# Patient Record
Sex: Male | Born: 1955 | Hispanic: No
Health system: Southern US, Community
[De-identification: ages and names within clinical notes are randomized; demographics above are authoritative.]

## PROBLEM LIST (undated history)

## (undated) DIAGNOSIS — I509 Heart failure, unspecified: Secondary | ICD-10-CM

## (undated) DIAGNOSIS — E669 Obesity, unspecified: Secondary | ICD-10-CM

## (undated) DIAGNOSIS — E041 Nontoxic single thyroid nodule: Secondary | ICD-10-CM

## (undated) HISTORY — PX: SHOULDER ARTHROSCOPY: SHX128

## (undated) HISTORY — PX: THYROIDECTOMY, PARTIAL: SHX18

---

## 2004-10-05 ENCOUNTER — Ambulatory Visit: Payer: Self-pay

## 2004-11-02 ENCOUNTER — Other Ambulatory Visit: Payer: Self-pay

## 2004-11-20 ENCOUNTER — Ambulatory Visit: Payer: Self-pay | Admitting: Unknown Physician Specialty

## 2005-12-10 ENCOUNTER — Ambulatory Visit: Payer: Self-pay | Admitting: Unknown Physician Specialty

## 2006-03-17 IMAGING — CT CT NECK WITH CONTRAST
1 of 2 series · 9 of 14 positions shown, 12 images · IV contrast (agent unspecified)
Comparison: none

REASON FOR EXAM: Thyroid mass
COMMENTS:

PROCEDURE:     CT  - CT NECK WITH CONTRAST  - October 05, 2004  [DATE]
RESULT:     Axial images were obtained from the base of the skull to the
thoracic inlet with intravenous injection of contrast.

[Series 2: soft tissue · axial · 0.54mm/px · z∈[+121,+343]mm · 9 of 94 slices shown, 12 images]
[im 10/94  soft-tissue]
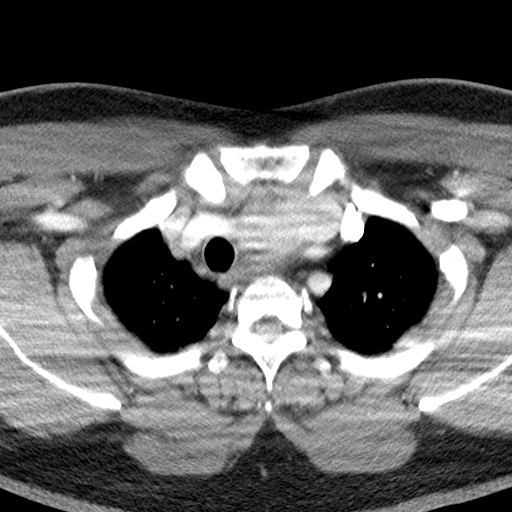
[im 10/94  bone]
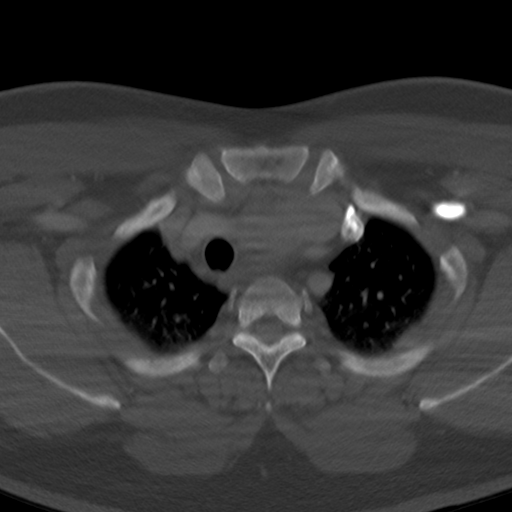
[im 19/94  bone]
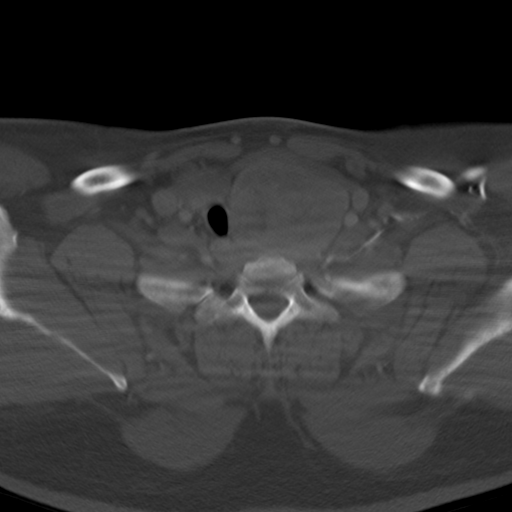
[im 28/94  bone]
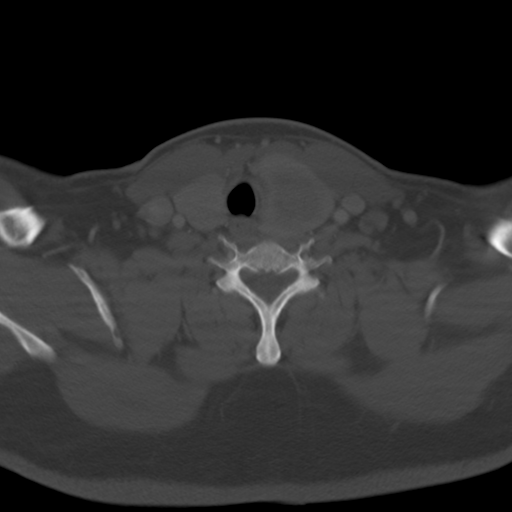
[im 38/94  bone]
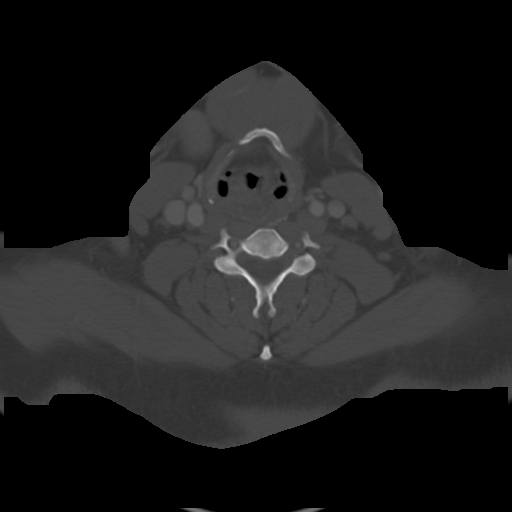
[im 47/94  soft-tissue]
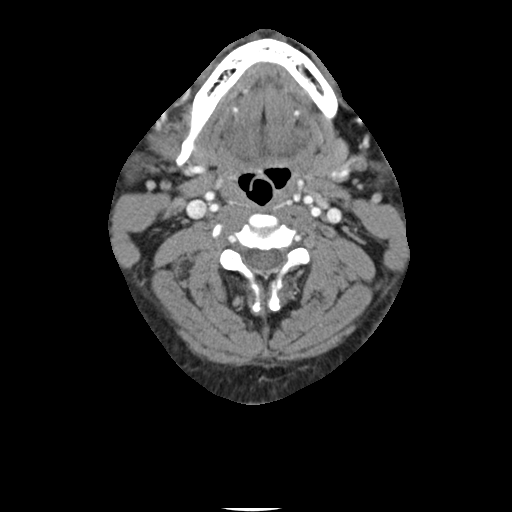
[im 47/94  bone]
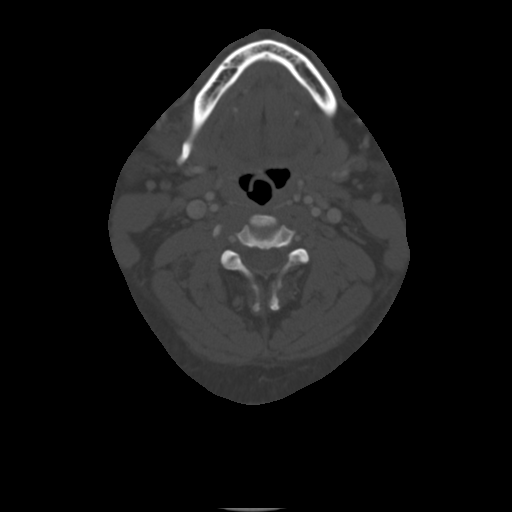
[im 56/94  bone]
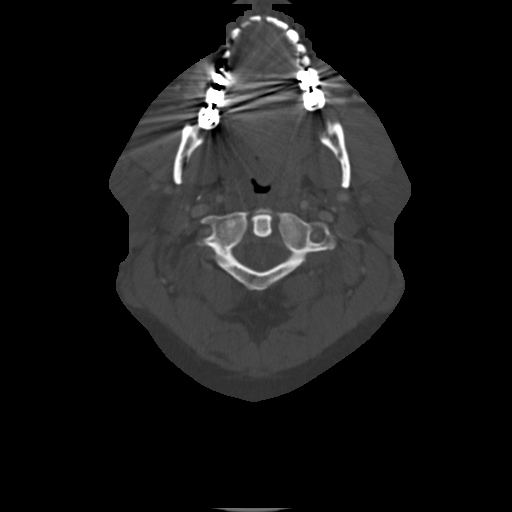
[im 66/94  bone]
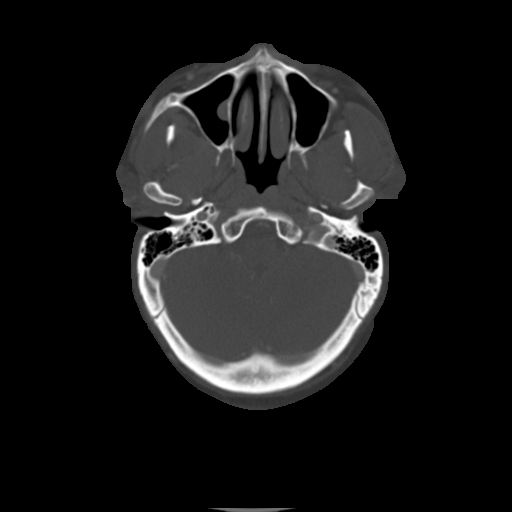
[im 75/94  bone]
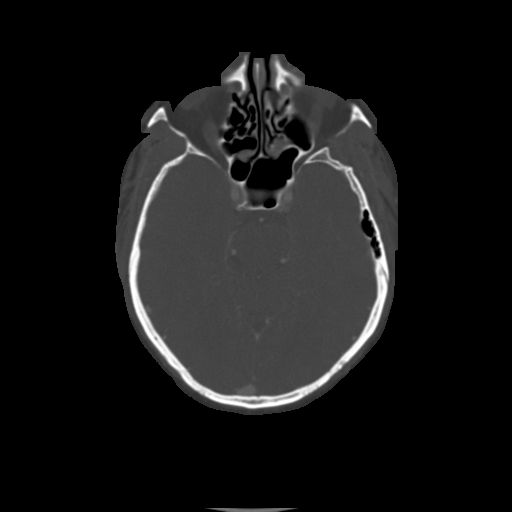
[im 84/94  soft-tissue]
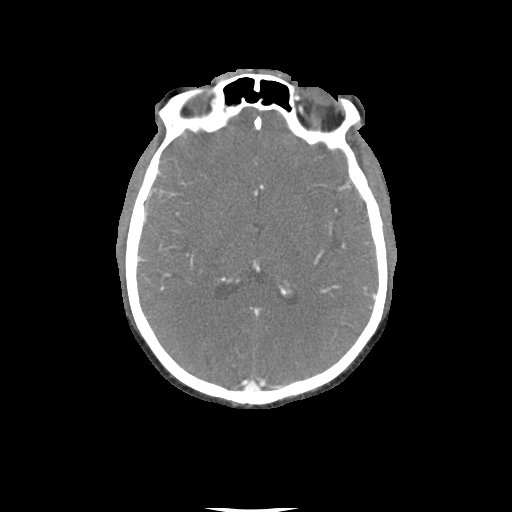
[im 84/94  bone]
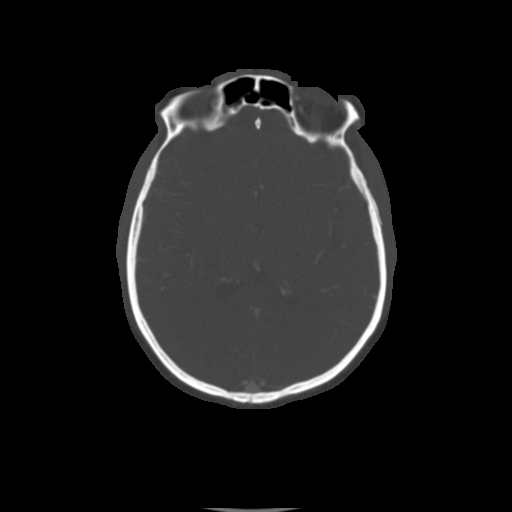

[9 of 14 positions shown; findings below may reference images not displayed]

FINDINGS: In the LEFT lobe of the thyroid, there is a mass measuring 5.0 x
6.5 cm with variable attenuation and enhancement with displacement of the
tracheal air column to the RIGHT of midline. The mass extends into the
thoracic inlet to the level of the brachiocephalic vein crossing.

There are a few, scattered lymph nodes bilaterally in the submandibular
gland region and posterior to the sternocleidomastoid muscles although they
do not appear enlarged. The largest one is on the RIGHT in the submandibular
gland measuring 9.0 mm in axis dimension.

The parotid as well as the submandibular glands appears intact.
IMPRESSION: Large, LEFT thyroid lobe mass with deviation of the trachea
to the RIGHT of midline. The left thyroid mass is extending to the thoracic
inlet.

The report was called to Dr. Ram and apparently the original dictation
was lost. The study was obtained on 10/05/2004 and called on 10/09/2004.

## 2014-09-20 ENCOUNTER — Ambulatory Visit
Admit: 2014-09-20 | Disposition: A | Discharge status: Home / Self Care | Payer: Self-pay | Attending: Family Medicine | Admitting: Family Medicine

## 2014-09-20 LAB — DOT URINE DIP
BLOOD: NEGATIVE
Glucose,UR: NEGATIVE
Protein: NEGATIVE
Specific Gravity: 1.02 (ref 1.000–1.030)

## 2015-12-22 ENCOUNTER — Encounter
Admission: RE | Disposition: A | Discharge status: Home / Self Care | Payer: Self-pay | Source: Ambulatory Visit | Attending: Unknown Physician Specialty

## 2015-12-22 ENCOUNTER — Ambulatory Visit
Admission: RE | Admit: 2015-12-22 | Discharge: 2015-12-22 | Disposition: A | Discharge status: Home / Self Care | Payer: Managed Care, Other (non HMO) | Source: Ambulatory Visit | Attending: Unknown Physician Specialty | Admitting: Unknown Physician Specialty

## 2015-12-22 ENCOUNTER — Ambulatory Visit: Payer: Managed Care, Other (non HMO) | Admitting: Anesthesiology

## 2015-12-22 ENCOUNTER — Encounter: Payer: Self-pay | Admitting: Anesthesiology

## 2015-12-22 DIAGNOSIS — Z6841 Body Mass Index (BMI) 40.0 and over, adult: Secondary | ICD-10-CM | POA: Insufficient documentation

## 2015-12-22 DIAGNOSIS — Z1211 Encounter for screening for malignant neoplasm of colon: Secondary | ICD-10-CM | POA: Diagnosis not present

## 2015-12-22 DIAGNOSIS — K640 First degree hemorrhoids: Secondary | ICD-10-CM | POA: Diagnosis not present

## 2015-12-22 HISTORY — PX: COLONOSCOPY WITH PROPOFOL: SHX5780

## 2015-12-22 SURGERY — COLONOSCOPY WITH PROPOFOL
Anesthesia: General

## 2015-12-22 MED ORDER — LIDOCAINE HCL (CARDIAC) 20 MG/ML IV SOLN
INTRAVENOUS | Status: DC | PRN
  Administered 2015-12-22: 30 mg via INTRAVENOUS

## 2015-12-22 MED ORDER — PROPOFOL 10 MG/ML IV BOLUS
INTRAVENOUS | Status: DC | PRN
  Administered 2015-12-22: 100 mg via INTRAVENOUS

## 2015-12-22 MED ORDER — PROPOFOL 500 MG/50ML IV EMUL
INTRAVENOUS | Status: DC | PRN
  Administered 2015-12-22: 150 ug/kg/min via INTRAVENOUS

## 2015-12-22 MED ORDER — SODIUM CHLORIDE 0.9 % IV SOLN
INTRAVENOUS | Status: DC
  Administered 2015-12-22: 10:00:00 via INTRAVENOUS

## 2015-12-22 MED ORDER — SODIUM CHLORIDE 0.9 % IV SOLN: INTRAVENOUS | Status: DC

## 2015-12-22 NOTE — Anesthesia Preprocedure Evaluation (Signed)
Anesthesia Evaluation  Patient identified by MRN, date of birth, ID band Patient awake    Reviewed: Allergy & Precautions, H&P , NPO status , Patient's Chart, lab work & pertinent test results, reviewed documented beta blocker date and time   History of Anesthesia Complications Negative for: history of anesthetic complications  Airway Mallampati: IV  TM Distance: >3 FB Neck ROM: full    Dental no notable dental hx. (+) Teeth Intact   Pulmonary neg pulmonary ROS,    Pulmonary exam normal breath sounds clear to auscultation       Cardiovascular Exercise Tolerance: Good negative cardio ROS Normal cardiovascular exam Rhythm:regular Rate:Normal     Neuro/Psych negative neurological ROS  negative psych ROS   GI/Hepatic negative GI ROS, Neg liver ROS,   Endo/Other  neg diabetesMorbid obesity  Renal/GU negative Renal ROS  negative genitourinary   Musculoskeletal   Abdominal   Peds  Hematology negative hematology ROS (+)   Anesthesia Other Findings History reviewed. No pertinent past medical history.   Reproductive/Obstetrics negative OB ROS                             Anesthesia Physical Anesthesia Plan  ASA: III  Anesthesia Plan: General   Post-op Pain Management:    Induction:   Airway Management Planned:   Additional Equipment:   Intra-op Plan:   Post-operative Plan:   Informed Consent: I have reviewed the patients History and Physical, chart, labs and discussed the procedure including the risks, benefits and alternatives for the proposed anesthesia with the patient or authorized representative who has indicated his/her understanding and acceptance.   Dental Advisory Given  Plan Discussed with: Anesthesiologist, CRNA and Surgeon  Anesthesia Plan Comments:         Anesthesia Quick Evaluation

## 2015-12-22 NOTE — H&P (Signed)
   Primary Care Physician:  Estell HarpinVINES,DAIN, MD Primary Gastroenterologist:  Dr. Mechele CollinElliott  Pre-Procedure History & Physical: HPI:  Bryce Jakesed Douglas Charbonnet is a 60 y.o. male is here for an colonoscopy.   History reviewed. No pertinent past medical history.  History reviewed. No pertinent past surgical history.  Prior to Admission medications   Medication Sig Start Date End Date Taking? Authorizing Provider  naproxen sodium (ANAPROX) 220 MG tablet Take 220 mg by mouth 2 (two) times daily with a meal.   Yes Historical Provider, MD    Allergies as of 11/25/2015  . (Not on File)    History reviewed. No pertinent family history.  Social History   Social History  . Marital Status: Married    Spouse Name: N/A  . Number of Children: N/A  . Years of Education: N/A   Occupational History  . Not on file.   Social History Main Topics  . Smoking status: Not on file  . Smokeless tobacco: Not on file  . Alcohol Use: Not on file  . Drug Use: Not on file  . Sexual Activity: Not on file   Other Topics Concern  . Not on file   Social History Narrative  . No narrative on file    Review of Systems: See HPI, otherwise negative ROS  Physical Exam: BP 127/70 mmHg  Pulse 47  Temp(Src) 97.8 F (36.6 C) (Oral)  Resp 17  Ht 5\' 8"  (1.727 m)  Wt 120.203 kg (265 lb)  BMI 40.30 kg/m2  SpO2 100% General:   Alert,  pleasant and cooperative in NAD Head:  Normocephalic and atraumatic. Neck:  Supple; no masses or thyromegaly. Lungs:  Clear throughout to auscultation.    Heart:  Regular rate and rhythm. Abdomen:  Soft, nontender and nondistended. Normal bowel sounds, without guarding, and without rebound.   Neurologic:  Alert and  oriented x4;  grossly normal neurologically.  Impression/Plan: Bryce Brock is here for an colonoscopy to be performed for screening colonoscopy  Risks, benefits, limitations, and alternatives regarding  colonoscopy have been reviewed with the patient.  Questions  have been answered.  All parties agreeable.   Lynnae PrudeELLIOTT, ROBERT, MD  12/22/2015, 10:14 AM

## 2015-12-22 NOTE — Transfer of Care (Signed)
Immediate Anesthesia Transfer of Care Note  Patient: Bryce Brock  Procedure(s) Performed: Procedure(s): COLONOSCOPY WITH PROPOFOL (N/A)  Patient Location: Endoscopy Unit  Anesthesia Type:General  Level of Consciousness: sedated  Airway & Oxygen Therapy: Patient Spontanous Breathing and Patient connected to nasal cannula oxygen  Post-op Assessment: Report given to RN and Post -op Vital signs reviewed and stable  Post vital signs: Reviewed and stable  Last Vitals:  Filed Vitals:   12/22/15 0931  BP: 127/70  Pulse: 47  Temp: 36.6 C  Resp: 17    Last Pain: There were no vitals filed for this visit.       Complications: No apparent anesthesia complications

## 2015-12-22 NOTE — Anesthesia Postprocedure Evaluation (Signed)
Anesthesia Post Note  Patient: Bryce Brock  Procedure(s) Performed: Procedure(s) (LRB): COLONOSCOPY WITH PROPOFOL (N/A)  Patient location during evaluation: Endoscopy Anesthesia Type: General Level of consciousness: awake and alert Pain management: pain level controlled Vital Signs Assessment: post-procedure vital signs reviewed and stable Respiratory status: spontaneous breathing, nonlabored ventilation, respiratory function stable and patient connected to nasal cannula oxygen Cardiovascular status: blood pressure returned to baseline and stable Postop Assessment: no signs of nausea or vomiting Anesthetic complications: no    Last Vitals:  Filed Vitals:   12/22/15 1039 12/22/15 1040  BP: 99/59 99/59  Pulse: 55   Temp: 36.4 C   Resp: 19     Last Pain: There were no vitals filed for this visit.               Lenard SimmerAndrew Diego Ulbricht

## 2015-12-22 NOTE — Op Note (Signed)
[facility] Gastroenterology Patient Name: Bryce Brock Procedure Date: 12/22/2015 10:13 AM MRN: [id number] Account #: [id number] Date of Birth: 04-25-1956 Admit Type: Outpatient Age: [age] [facility] Gender: Male Note Status: Finalized Procedure:            Colonoscopy Indications:          Screening for colorectal malignant neoplasm Providers:            Scot Junobert T. Elliott, MD Referring MD:         Estell Harpinain Vines, MD (Referring MD) Medicines:            Propofol per Anesthesia Complications:        No immediate complications. Procedure:            Pre-Anesthesia Assessment:                       - After reviewing the risks and benefits, the patient                        was deemed in satisfactory condition to undergo the                        procedure.                       After obtaining informed consent, the colonoscope was                        passed under direct vision. Throughout the procedure,                        the patient's blood pressure, pulse, and oxygen                        saturations were monitored continuously. The                        Colonoscope was introduced through the anus and                        advanced to the the cecum, identified by appendiceal                        orifice and ileocecal valve. The colonoscopy was                        performed without difficulty. The patient tolerated the                        procedure well. The quality of the bowel preparation                        was excellent. Findings:      Internal hemorrhoids were found during endoscopy. The hemorrhoids were       small and Grade I (internal hemorrhoids that do not prolapse).      The exam was otherwise without abnormality. Impression:           - Internal hemorrhoids.                       - The examination was otherwise  normal.                       - No specimens collected. Recommendation:       - Repeat colonoscopy in 10 years for  screening purposes. Scot Junobert T Elliott, MD 12/22/2015 10:54:03 AM This report has been signed electronically. Number of Addenda: 0 Note Initiated On: 12/22/2015 10:13 AM Scope Withdrawal Time: 0 hours 9 minutes 2 seconds  Total Procedure Duration: 0 hours 13 minutes 29 seconds       [facility]

## 2015-12-23 ENCOUNTER — Encounter: Payer: Self-pay | Admitting: Unknown Physician Specialty

## 2019-04-28 ENCOUNTER — Inpatient Hospital Stay
Admission: EM | Admit: 2019-04-28 | Discharge: 2019-04-30 | DRG: 291 | Disposition: A | Discharge status: Home / Self Care | Payer: Commercial Managed Care - PPO | Source: Ambulatory Visit | Attending: Internal Medicine | Admitting: Internal Medicine

## 2019-04-28 ENCOUNTER — Encounter: Payer: Self-pay | Admitting: Emergency Medicine

## 2019-04-28 ENCOUNTER — Emergency Department: Payer: Commercial Managed Care - PPO

## 2019-04-28 ENCOUNTER — Inpatient Hospital Stay: Payer: Commercial Managed Care - PPO

## 2019-04-28 ENCOUNTER — Other Ambulatory Visit: Payer: Self-pay

## 2019-04-28 DIAGNOSIS — R778 Other specified abnormalities of plasma proteins: Secondary | ICD-10-CM

## 2019-04-28 DIAGNOSIS — I248 Other forms of acute ischemic heart disease: Secondary | ICD-10-CM | POA: Diagnosis present

## 2019-04-28 DIAGNOSIS — R509 Fever, unspecified: Secondary | ICD-10-CM | POA: Diagnosis not present

## 2019-04-28 DIAGNOSIS — E669 Obesity, unspecified: Secondary | ICD-10-CM | POA: Diagnosis present

## 2019-04-28 DIAGNOSIS — I5031 Acute diastolic (congestive) heart failure: Secondary | ICD-10-CM | POA: Diagnosis present

## 2019-04-28 DIAGNOSIS — I160 Hypertensive urgency: Secondary | ICD-10-CM | POA: Diagnosis present

## 2019-04-28 DIAGNOSIS — Z20828 Contact with and (suspected) exposure to other viral communicable diseases: Secondary | ICD-10-CM | POA: Diagnosis present

## 2019-04-28 DIAGNOSIS — Z8249 Family history of ischemic heart disease and other diseases of the circulatory system: Secondary | ICD-10-CM

## 2019-04-28 DIAGNOSIS — I50811 Acute right heart failure: Secondary | ICD-10-CM | POA: Diagnosis present

## 2019-04-28 DIAGNOSIS — I509 Heart failure, unspecified: Secondary | ICD-10-CM | POA: Diagnosis not present

## 2019-04-28 DIAGNOSIS — Z79899 Other long term (current) drug therapy: Secondary | ICD-10-CM

## 2019-04-28 DIAGNOSIS — E890 Postprocedural hypothyroidism: Secondary | ICD-10-CM | POA: Diagnosis present

## 2019-04-28 DIAGNOSIS — I110 Hypertensive heart disease with heart failure: Secondary | ICD-10-CM | POA: Diagnosis not present

## 2019-04-28 DIAGNOSIS — Z6841 Body Mass Index (BMI) 40.0 and over, adult: Secondary | ICD-10-CM

## 2019-04-28 DIAGNOSIS — Z8639 Personal history of other endocrine, nutritional and metabolic disease: Secondary | ICD-10-CM

## 2019-04-28 DIAGNOSIS — R0600 Dyspnea, unspecified: Secondary | ICD-10-CM

## 2019-04-28 HISTORY — DX: Obesity, unspecified: E66.9

## 2019-04-28 HISTORY — DX: Nontoxic single thyroid nodule: E04.1

## 2019-04-28 LAB — COMPREHENSIVE METABOLIC PANEL
ALT: 29 U/L (ref 0–44)
AST: 20 U/L (ref 15–41)
Albumin: 3.7 g/dL (ref 3.5–5.0)
Alkaline Phosphatase: 70 U/L (ref 38–126)
Anion gap: 9 (ref 5–15)
BUN: 16 mg/dL (ref 8–23)
CO2: 23 mmol/L (ref 22–32)
Calcium: 8.7 mg/dL — ABNORMAL LOW (ref 8.9–10.3)
Chloride: 106 mmol/L (ref 98–111)
Creatinine, Ser: 0.95 mg/dL (ref 0.61–1.24)
GFR calc Af Amer: >60 mL/min (ref >60)
GFR calc non Af Amer: >60 mL/min (ref >60)
Glucose, Bld: 99 mg/dL (ref 70–99)
Potassium: 3.8 mmol/L (ref 3.5–5.1)
Sodium: 138 mmol/L (ref 135–145)
Total Bilirubin: 0.9 mg/dL (ref 0.3–1.2)
Total Protein: 6.8 g/dL (ref 6.5–8.1)

## 2019-04-28 LAB — LACTIC ACID, PLASMA: Lactic Acid, Venous: 1.2 mmol/L (ref 0.5–1.9)

## 2019-04-28 LAB — CBC
HCT: 41.1 % (ref 39.0–52.0)
Hemoglobin: 12.7 g/dL — ABNORMAL LOW (ref 13.0–17.0)
MCH: 21.7 pg — ABNORMAL LOW (ref 26.0–34.0)
MCHC: 30.9 g/dL (ref 30.0–36.0)
MCV: 70.1 fL — ABNORMAL LOW (ref 80.0–100.0)
Platelets: 190 10*3/uL (ref 150–400)
RBC: 5.86 MIL/uL — ABNORMAL HIGH (ref 4.22–5.81)
RDW: 18.3 % — ABNORMAL HIGH (ref 11.5–15.5)
WBC: 6.3 10*3/uL (ref 4.0–10.5)
nRBC: 0 % (ref 0.0–0.2)

## 2019-04-28 LAB — TROPONIN I (HIGH SENSITIVITY): Troponin I (High Sensitivity): 151 ng/L (ref <18)

## 2019-04-28 LAB — BRAIN NATRIURETIC PEPTIDE: B Natriuretic Peptide: 268 pg/mL — ABNORMAL HIGH (ref 0.0–100.0)

## 2019-04-28 LAB — TSH: TSH: 2.671 u[IU]/mL (ref 0.350–4.500)

## 2019-04-28 LAB — MAGNESIUM: Magnesium: 2.2 mg/dL (ref 1.7–2.4)

## 2019-04-28 MED ORDER — SODIUM CHLORIDE 0.9% FLUSH
3.0000 mL | Freq: Two times a day (BID) | INTRAVENOUS | Status: DC
  Administered 2019-04-28 – 2019-04-29 (×3): 3 mL via INTRAVENOUS

## 2019-04-28 MED ORDER — ACETAMINOPHEN 325 MG PO TABS: 650.0000 mg | ORAL_TABLET | ORAL | Status: DC | PRN

## 2019-04-28 MED ORDER — FUROSEMIDE 10 MG/ML IJ SOLN
40.0000 mg | Freq: Once | INTRAMUSCULAR | Status: AC
  Administered 2019-04-28: 40 mg via INTRAVENOUS
  Filled 2019-04-28 (×2): qty 4

## 2019-04-28 MED ORDER — LABETALOL HCL 5 MG/ML IV SOLN: 20.0000 mg | INTRAVENOUS | Status: DC | PRN

## 2019-04-28 MED ORDER — FUROSEMIDE 10 MG/ML IJ SOLN
40.0000 mg | Freq: Two times a day (BID) | INTRAMUSCULAR | Status: DC
  Administered 2019-04-29 – 2019-04-30 (×3): 40 mg via INTRAVENOUS
  Filled 2019-04-28 (×3): qty 4

## 2019-04-28 MED ORDER — ENOXAPARIN SODIUM 40 MG/0.4ML ~~LOC~~ SOLN
40.0000 mg | Freq: Two times a day (BID) | SUBCUTANEOUS | Status: DC
  Administered 2019-04-28 – 2019-04-30 (×4): 40 mg via SUBCUTANEOUS
  Filled 2019-04-28 (×4): qty 0.4

## 2019-04-28 MED ORDER — SODIUM CHLORIDE 0.9 % IV SOLN: 250.0000 mL | INTRAVENOUS | Status: DC | PRN

## 2019-04-28 MED ORDER — ONDANSETRON HCL 4 MG/2ML IJ SOLN: 4.0000 mg | Freq: Four times a day (QID) | INTRAMUSCULAR | Status: DC | PRN

## 2019-04-28 MED ORDER — SODIUM CHLORIDE 0.9% FLUSH: 3.0000 mL | INTRAVENOUS | Status: DC | PRN

## 2019-04-28 MED ORDER — ASPIRIN EC 81 MG PO TBEC
81.0000 mg | DELAYED_RELEASE_TABLET | Freq: Every day | ORAL | Status: DC
  Administered 2019-04-28 – 2019-04-30 (×3): 81 mg via ORAL
  Filled 2019-04-28 (×3): qty 1

## 2019-04-28 MED ORDER — LISINOPRIL 5 MG PO TABS
5.0000 mg | ORAL_TABLET | Freq: Every day | ORAL | Status: DC
  Administered 2019-04-28 – 2019-04-30 (×3): 5 mg via ORAL
  Filled 2019-04-28 (×3): qty 1

## 2019-04-28 MED ORDER — ZOLPIDEM TARTRATE 5 MG PO TABS: 5.0000 mg | ORAL_TABLET | Freq: Every evening | ORAL | Status: DC | PRN

## 2019-04-28 MED ORDER — IOHEXOL 350 MG/ML SOLN
75.0000 mL | Freq: Once | INTRAVENOUS | Status: AC | PRN
  Administered 2019-04-28: 75 mL via INTRAVENOUS

## 2019-04-28 NOTE — ED Notes (Signed)
Pt given meal tray.

## 2019-04-28 NOTE — ED Notes (Signed)
Called and placed on hold. Had to hang up due to patient calling out.

## 2019-04-28 NOTE — Plan of Care (Signed)
  Problem: Safety: Goal: Ability to remain free from injury will improve Outcome: Progressing   Problem: Education: Goal: Ability to demonstrate management of disease process will improve Outcome: Progressing Goal: Ability to verbalize understanding of medication therapies will improve Outcome: Progressing Goal: Individualized Educational Video(s) Outcome: Progressing   Problem: Cardiac: Goal: Ability to achieve and maintain adequate cardiopulmonary perfusion will improve Outcome: Progressing

## 2019-04-28 NOTE — ED Triage Notes (Signed)
Pt sent from UC with c/o SOB xfew days. Pt denies fever or cough. PT is 97% on RA, RR even and unlabored. PT tested for covid yesterday and awaiting results

## 2019-04-28 NOTE — ED Notes (Signed)
Pt ambulatory to bathroom

## 2019-04-28 NOTE — ED Notes (Signed)
Admitting MD at bedside.

## 2019-04-28 NOTE — H&P (Signed)
[place] at [facility]   PATIENT NAME: Bryce Brock    MR#:  [id number]  DATE OF BIRTH:  01-13-1956  DATE OF ADMISSION:  04/28/2019  PRIMARY CARE PHYSICIAN: Estell Harpin, MD   REQUESTING/REFERRING PHYSICIAN: Jene Every, MD  CHIEF COMPLAINT:   Chief Complaint  Patient presents with  . Shortness of Breath    HISTORY OF PRESENT ILLNESS:  Bryce Brock  is a 63 y.o. obese African-American male with no chronic medical problems, who presented to the emergency room with acute onset of recent exertional dyspnea which have been worsening over the last couple of days with associated paroxysmal nocturnal dyspnea that woke him up from sleep last night and two-pillow orthopnea with mild lower extremity edema.  He earlier he earlier reported chest tightness to the ER physician however denied it during my interview.  No fever or chills.  He admitted to dry cough with the symptoms and occasional wheezing.  No nausea vomiting or abdominal pain.  No leg pain no recent travels or surgeries.  No sick exposures or exposure to COVID-19.  He works at [employer].  Upon presentation to the emergency room, blood pressure was initially 135/80 and later on 145/104 then 154/104, with a temperature of 99.1 and otherwise normal vital signs.  Pulse extremity was 96 to 99% on room air.  Labs revealed elevated BNP of 268 and troponin I of 151 over otherwise unremarkable.  Two-view chest x-ray showed mild cardiomegaly and pulmonary vascular prominence without overt edema or acute airspace opacity.  EKG showed normal sinus rhythm with rate of 67 with Q waves inferiorly and slightly poor R wave progression.  The patient was given 40 mg of IV Lasix.  He will be admitted to telemetry bed for further evaluation and management of new onset acute CHF. PAST MEDICAL HISTORY:   Past Medical History:  Diagnosis Date  . Obesity   . Thyroid nodule     PAST SURGICAL HISTORY:   Past Surgical History:  Procedure  Laterality Date  . COLONOSCOPY WITH PROPOFOL N/A 12/22/2015   Procedure: COLONOSCOPY WITH PROPOFOL;  Surgeon: Scot Jun, MD;  Location: [facility] ENDOSCOPY;  Service: Endoscopy;  Laterality: N/A;  . SHOULDER ARTHROSCOPY    . THYROIDECTOMY, PARTIAL      SOCIAL HISTORY:   Social History   Tobacco Use  . Smoking status: Never Smoker  . Smokeless tobacco: Never Used  Substance Use Topics  . Alcohol use: Not Currently    FAMILY HISTORY:   Family History  Problem Relation Age of Onset  . Breast cancer Mother   . Hypertension Father   . Coronary artery disease Father   . Diabetes Mellitus I Father   . Hypertension Brother     DRUG ALLERGIES:  No Known Allergies  REVIEW OF SYSTEMS:   ROS As per history of present illness. All pertinent systems were reviewed above. Constitutional,  HEENT, cardiovascular, respiratory, GI, GU, musculoskeletal, neuro, psychiatric, endocrine,  integumentary and hematologic systems were reviewed and are otherwise  negative/unremarkable except for positive findings mentioned above in the HPI.   MEDICATIONS AT HOME:   Prior to Admission medications   Medication Sig Start Date End Date Taking? Authorizing Provider  naproxen sodium (ANAPROX) 220 MG tablet Take 220 mg by mouth 2 (two) times daily with a meal.    [provider]      VITAL SIGNS:  Blood pressure (!) 154/104, pulse 65, temperature 99.1 F (37.3 C), temperature source Oral, resp.  rate 18, SpO2 99 %.  PHYSICAL EXAMINATION:  Physical Exam  GENERAL:  63 y.o.-year-old obese African-American male patient in minimal respiratory distress with conversational dyspnea. EYES: Pupils equal, round, reactive to light and accommodation. No scleral icterus. Extraocular muscles intact.  HEENT: Head atraumatic, normocephalic. Oropharynx and nasopharynx clear.  NECK:  Supple, no jugular venous distention. No thyroid enlargement, no tenderness.  LUNGS: Slightly diminished bibasal breath  sounds with no wheezing, rales,rhonchi or crepitation. No use of accessory muscles of respiration.  CARDIOVASCULAR: Regular rate and rhythm, S1, S2 normal. No murmurs, rubs, or gallops.  ABDOMEN: Soft, nondistended, nontender. Bowel sounds present. No organomegaly or mass.  EXTREMITIES: 1+ bilateral lower extremity edema with no cyanosis, or clubbing.  NEUROLOGIC: Cranial nerves II through XII are intact. Muscle strength 5/5 in all extremities. Sensation intact. Gait not checked.  PSYCHIATRIC: The patient is alert and oriented x 3.  Normal affect and good eye contact. SKIN: No obvious rash, lesion, or ulcer.   LABORATORY PANEL:   CBC Recent Labs  Lab 04/28/19 1836  WBC 6.3  HGB 12.7*  HCT 41.1  PLT 190   ------------------------------------------------------------------------------------------------------------------  Chemistries  Recent Labs  Lab 04/28/19 1836  NA 138  K 3.8  CL 106  CO2 23  GLUCOSE 99  BUN 16  CREATININE 0.95  CALCIUM 8.7*  AST 20  ALT 29  ALKPHOS 70  BILITOT 0.9   ------------------------------------------------------------------------------------------------------------------  Cardiac Enzymes No results for input(s): TROPONINI in the last 168 hours. ------------------------------------------------------------------------------------------------------------------  RADIOLOGY:  Dg Chest 2 View  Result Date: 04/28/2019 CLINICAL DATA:  Shortness of breath EXAM: CHEST - 2 VIEW COMPARISON:  11/02/2004 FINDINGS: Mild cardiomegaly. Pulmonary vascular prominence. Disc degenerative disease of the thoracic spine. IMPRESSION: Mild cardiomegaly and pulmonary vascular prominence without overt edema or acute appearing airspace opacity. Electronically Signed   By: Eddie Candle M.D.   On: 04/28/2019 17:28      IMPRESSION AND PLAN:   1.  New onset acute congestive heart failure, currently unspecified.  I suspect diastolic etiology.  The patient will be  admitted to a telemetry bed and will be diuresed with IV Lasix.  Will follow serial cardiac enzymes.  Will follow electrolytes including potassium and magnesium with diuresis. Will obtain a 2D echo  and a cardiology consultation in a.m. and notify Dr. Clayborn Bigness regarding the consult.  2.  Elevated troponin I.  This is likely secondary to demand ischemia.  Will follow more serial troponin I's.  The patient has no current chest pain.  Given his low-grade fever I would like to get a chest CTA for further assessment.  3.  Hypertensive urgency.  [place] contributing to #1.  The patient will be placed on ACE inhibitor therapy with lisinopril.  We will also add as needed IV labetalol for optimal control.  4.  History of thyroid nodule status post partial thyroidectomy.  We will check TSH level.  5.  DVT prophylaxis.  Subcutaneous Lovenox   All the records are reviewed and case discussed with ED provider. The plan of care was discussed in details with the patient (and family). I answered all questions. The patient agreed to proceed with the above mentioned plan. Further management will depend upon hospital course.   CODE STATUS: Full code  TOTAL TIME TAKING CARE OF THIS PATIENT: 55 minutes.    Christel Mormon M.D on 04/28/2019 at 8:15 PM  [facility]   From 7 PM-7 AM, contact night-coverage [email or web address]  CC: Primary care physician; Maeola Sarah,  MD   Note: This dictation was prepared with Dragon dictation along with smaller phrase technology. Any transcriptional errors that result from this process are unintentional.

## 2019-04-28 NOTE — ED Provider Notes (Signed)
[facility] Emergency Department Provider Note   ____________________________________________    I have reviewed the triage vital signs and the nursing notes.   HISTORY  Chief Complaint Shortness of Breath     HPI Bryce Brock is a 63 y.o. male who presents with complaints of shortness of breath.  Patient reports he has had shortness of breath with exertion as well as some chest tightness over the last couple of days.  This morning he woke up in the middle of the night struggling to breathe, he felt better after he sat up.  He has never had this before.  He denies pleurisy.  No fevers or chills.  No nausea or vomiting.  No myalgias.  No known coronavirus exposure.  No history of heart disease.  No recent travel  History reviewed. No pertinent past medical history.  There are no active problems to display for this patient.   Past Surgical History:  Procedure Laterality Date  . COLONOSCOPY WITH PROPOFOL N/A 12/22/2015   Procedure: COLONOSCOPY WITH PROPOFOL;  Surgeon: Scot Jun, MD;  Location: [facility] ENDOSCOPY;  Service: Endoscopy;  Laterality: N/A;    Prior to Admission medications   Medication Sig Start Date End Date Taking? Authorizing Provider  naproxen sodium (ANAPROX) 220 MG tablet Take 220 mg by mouth 2 (two) times daily with a meal.    [provider]     Allergies Patient has no known allergies.  No family history on file.  Social History Social History   Tobacco Use  . Smoking status: Never Smoker  . Smokeless tobacco: Never Used  Substance Use Topics  . Alcohol use: Not Currently  . Drug use: Never    Review of Systems  Constitutional: No fever/chills Eyes: No visual changes.  ENT: No sore throat. Cardiovascular: As above Respiratory: As above Gastrointestinal: No abdominal pain.  No nausea, no vomiting.   Genitourinary: Negative for dysuria. Musculoskeletal: Negative for back pain. Skin: Negative for  rash. Neurological: Negative for headaches or weakness   ____________________________________________   PHYSICAL EXAM:  VITAL SIGNS: ED Triage Vitals  Enc Vitals Group     BP 04/28/19 1708 135/80     Pulse Rate 04/28/19 1705 66     Resp 04/28/19 1705 16     Temp 04/28/19 1709 99.1 F (37.3 C)     Temp Source 04/28/19 1709 Oral     SpO2 04/28/19 1705 96 %     Weight --      Height --      Head Circumference --      Peak Flow --      Pain Score 04/28/19 1706 0     Pain Loc --      Pain Edu? --      Excl. in GC? --     Constitutional: Alert and oriented.  Eyes: Conjunctivae are normal.   Mouth/Throat: Mucous membranes are moist.    Cardiovascular: Normal rate, regular rhythm.  Systolic ejection murmur good peripheral circulation. Respiratory: Normal respiratory effort.  No retractions.  Gastrointestinal: Soft and nontender. No distention.  No CVA tenderness.  Musculoskeletal: No lower extremity tenderness nor edema.  Warm and well perfused Neurologic:  Normal speech and language. No gross focal neurologic deficits are appreciated.  Skin:  Skin is warm, dry and intact. No rash noted. Psychiatric: Mood and affect are normal. Speech and behavior are normal.  ____________________________________________   LABS (all labs ordered are listed, but only abnormal results are  displayed)  Labs Reviewed  CBC - Abnormal; Notable for the following components:      Result Value   RBC 5.86 (*)    Hemoglobin 12.7 (*)    MCV 70.1 (*)    MCH 21.7 (*)    RDW 18.3 (*)    All other components within normal limits  COMPREHENSIVE METABOLIC PANEL - Abnormal; Notable for the following components:   Calcium 8.7 (*)    All other components within normal limits  BRAIN NATRIURETIC PEPTIDE - Abnormal; Notable for the following components:   B Natriuretic Peptide 268.0 (*)    All other components within normal limits  TROPONIN I (HIGH SENSITIVITY) - Abnormal; Notable for the following  components:   Troponin I (High Sensitivity) 151 (*)    All other components within normal limits  SARS CORONAVIRUS 2 (TAT 6-24 HRS)   ____________________________________________  EKG  ED ECG REPORT I, Lavonia Drafts, the attending physician, personally viewed and interpreted this ECG.  Date: 04/28/2019  Rhythm: normal sinus rhythm QRS Axis: normal Intervals: normal ST/T Wave abnormalities: Nonspecific changes Narrative Interpretation: no evidence of acute ischemia  ____________________________________________  RADIOLOGY  Chest x-ray shows cardiomegaly and vascular prominence ____________________________________________   PROCEDURES  Procedure(s) performed: No  Procedures   Critical Care performed: yes  CRITICAL CARE Performed by: Lavonia Drafts   Total critical care time:30 minutes  Critical care time was exclusive of separately billable procedures and treating other patients.  Critical care was necessary to treat or prevent imminent or life-threatening deterioration.  Critical care was time spent personally by me on the following activities: development of treatment plan with patient and/or surrogate as well as nursing, discussions with consultants, evaluation of patient's response to treatment, examination of patient, obtaining history from patient or surrogate, ordering and performing treatments and interventions, ordering and review of laboratory studies, ordering and review of radiographic studies, pulse oximetry and re-evaluation of patient's condition.  ____________________________________________   INITIAL IMPRESSION / ASSESSMENT AND PLAN / ED COURSE  Pertinent labs & imaging results that were available during my care of the patient were reviewed by me and considered in my medical decision making (see chart for details).  Patient presents with shortness of breath with exertion as well as some chest tightness.  Did have shortness of breath with no  exertion in the middle of the night as well, suspicious for CHF given his chest x-ray.  Pending labs   Lab work significant for elevated troponin of 150 as well as elevated BNP.  No active chest pain, suspect elevated troponin related to new onset CHF.  Have discussed with hospitalist for admission  Will give IV Lasix     ____________________________________________   FINAL CLINICAL IMPRESSION(S) / ED DIAGNOSES  Final diagnoses:  Acute congestive heart failure, unspecified heart failure type ([place])  Elevated troponin        Note:  This document was prepared using Dragon voice recognition software and may include unintentional dictation errors.   Lavonia Drafts, MD 04/28/19 1925

## 2019-04-28 NOTE — ED Notes (Signed)
Called floor for pt, per Secretary RN doing assessments at this time. Advised I can call back in shortly.

## 2019-04-28 NOTE — Progress Notes (Signed)
Anticoagulation monitoring(Lovenox):  63 yo male ordered Lovenox 40 mg Q24h  Filed Weights   04/28/19 2034  Weight: 271 lb 8 oz (123.2 kg)   BMI 41   Lab Results  Component Value Date   CREATININE 0.95 04/28/2019   Estimated Creatinine Clearance: 101.7 mL/min (by C-G formula based on SCr of 0.95 mg/dL). Hemoglobin & Hematocrit     Component Value Date/Time   HGB 12.7 (L) 04/28/2019 1836   HCT 41.1 04/28/2019 1836     Per Protocol for Patient with estCrcl > 30 ml/min and BMI > 40, will transition to Lovenox 40 mg Q12h.

## 2019-04-28 NOTE — ED Notes (Signed)
Pt texting on phone while triaging

## 2019-04-28 NOTE — ED Triage Notes (Signed)
First Nurse Note:  Arrives from [facility] for evaluation of exertional SOB for the past several days.  Patient denies chest pain.  Was swabbed for COVID yesterday, results pending.  Patient is AAOx3.  Skin warm and dry.  No SOB/ DOE noted.

## 2019-04-29 ENCOUNTER — Inpatient Hospital Stay
Admit: 2019-04-29 | Discharge: 2019-04-29 | Disposition: A | Discharge status: Home / Self Care | Payer: Commercial Managed Care - PPO | Attending: Family Medicine | Admitting: Family Medicine

## 2019-04-29 DIAGNOSIS — R778 Other specified abnormalities of plasma proteins: Secondary | ICD-10-CM | POA: Diagnosis present

## 2019-04-29 DIAGNOSIS — Z8639 Personal history of other endocrine, nutritional and metabolic disease: Secondary | ICD-10-CM

## 2019-04-29 DIAGNOSIS — I160 Hypertensive urgency: Secondary | ICD-10-CM | POA: Diagnosis present

## 2019-04-29 LAB — BRAIN NATRIURETIC PEPTIDE: B Natriuretic Peptide: 255 pg/mL — ABNORMAL HIGH (ref 0.0–100.0)

## 2019-04-29 LAB — BASIC METABOLIC PANEL
Anion gap: 8 (ref 5–15)
BUN: 18 mg/dL (ref 8–23)
CO2: 25 mmol/L (ref 22–32)
Calcium: 8.9 mg/dL (ref 8.9–10.3)
Chloride: 108 mmol/L (ref 98–111)
Creatinine, Ser: 0.89 mg/dL (ref 0.61–1.24)
GFR calc Af Amer: >60 mL/min (ref >60)
GFR calc non Af Amer: >60 mL/min (ref >60)
Glucose, Bld: 98 mg/dL (ref 70–99)
Potassium: 3.7 mmol/L (ref 3.5–5.1)
Sodium: 141 mmol/L (ref 135–145)

## 2019-04-29 LAB — CBC WITH DIFFERENTIAL/PLATELET
Abs Immature Granulocytes: 0.03 10*3/uL (ref 0.00–0.07)
Basophils Absolute: 0.1 10*3/uL (ref 0.0–0.1)
Basophils Relative: 1 %
Eosinophils Absolute: 0.3 10*3/uL (ref 0.0–0.5)
Eosinophils Relative: 5 %
HCT: 43.3 % (ref 39.0–52.0)
Hemoglobin: 13.4 g/dL (ref 13.0–17.0)
Immature Granulocytes: 1 %
Lymphocytes Relative: 38 %
Lymphs Abs: 2.4 10*3/uL (ref 0.7–4.0)
MCH: 21.5 pg — ABNORMAL LOW (ref 26.0–34.0)
MCHC: 30.9 g/dL (ref 30.0–36.0)
MCV: 69.6 fL — ABNORMAL LOW (ref 80.0–100.0)
Monocytes Absolute: 0.9 10*3/uL (ref 0.1–1.0)
Monocytes Relative: 15 %
Neutro Abs: 2.5 10*3/uL (ref 1.7–7.7)
Neutrophils Relative %: 40 %
Platelets: 180 10*3/uL (ref 150–400)
RBC: 6.22 MIL/uL — ABNORMAL HIGH (ref 4.22–5.81)
RDW: 18.6 % — ABNORMAL HIGH (ref 11.5–15.5)
WBC: 6.2 10*3/uL (ref 4.0–10.5)
nRBC: 0 % (ref 0.0–0.2)

## 2019-04-29 LAB — TROPONIN I (HIGH SENSITIVITY)
Troponin I (High Sensitivity): 161 ng/L (ref <18)
Troponin I (High Sensitivity): 137 ng/L (ref <18)
Troponin I (High Sensitivity): 149 ng/L (ref <18)

## 2019-04-29 LAB — C-REACTIVE PROTEIN: CRP: <0.8 mg/dL (ref <1.0)

## 2019-04-29 LAB — ECHOCARDIOGRAM COMPLETE
Height: 68 in
Weight: 4283.2 oz

## 2019-04-29 LAB — SARS CORONAVIRUS 2 (TAT 6-24 HRS): SARS Coronavirus 2: NEGATIVE

## 2019-04-29 LAB — LACTIC ACID, PLASMA: Lactic Acid, Venous: 1 mmol/L (ref 0.5–1.9)

## 2019-04-29 NOTE — Progress Notes (Signed)
*  PRELIMINARY RESULTS* Echocardiogram 2D Echocardiogram has been performed.  Sherrie Sport 04/29/2019, 10:15 AM

## 2019-04-29 NOTE — Consult Note (Signed)
Cardiology consultation Indication congestive heart failure shortness of breath Referring physician Dr. Grandville Silos hospitalist,  primary physician Dr. Gelene Mink Date of consult 04/29/2019  Chief complaint shortness of breath  HPI 63 year old obese black male denies any chronic medical problems except for obesity and may be hypertension came to emergency room with acute exertional dyspnea worse over the last few days he has had PND orthopnea mild leg edema.  Patient had recent chest tightness seen in emergency room reportedly was limited no fever chills or sweats has had a dry cough occasional wheezing no nausea vomiting or diarrhea no recent travels no exposure to Covid works at [employer]  Patient presented with elevated blood pressure adequate sats BNP of 268 borderline troponins no chest pain EKG nondiagnostic cardiomegaly on chest x-ray patient was treated with Lasix in the emergency room which seemed to help her symptoms and cardiology was done consulted  Past  Obesity Thyroid disease Hypertension Possible obstructive sleep apnea  Past surgical history Colonoscopy Shoulder surgery  Social history No smoking No alcohol abuse  Family history Breast cancer Hypertension   disease Diabetes  Allergies none  Medications Naproxen as needed  Review of systems All pertinent negative Constitutional essentially negative HEENT denies any vision issues Cardiovascular denies atrial fibrillation myocardial infarction chest pain Respiratory occasional shortness of breath no history of pneumonia no asthma no COPD GI no nausea vomiting diarrhea no GERD Skin exam essentially normal Neurological no history of seizures syncope CVA  Physical exam Vitals blood pressure 150/100 pulse of 70 and regular temperature 99 respiratory rate of 18 O2 sat was 11 general 63 year old African-American obese male no respiratory distress Eye exam pupils equal and reactive to light sclera  anicteric HEENT atraumatic normocephalic Neck exam supple no JVD bruits adenopathy Lung exam slightly diminished bibasilar breath sounds no wheezing no rales minimal rhonchi Heart exam regular rate rhythm no significant gallops soft systolic flow murmur Abdominal exam is benign nondistended nontender positive bowel sounds Extremity trace lower extremity edema 1+ Neurologic exam grossly intact cranial nerves intact Skin exam normal Psychiatric alert oriented x3  Laboratory .  White count 6.3 H&H 05/24/1940 platelet count of 190 Chemistries unremarkable LFTs unremarkable Troponin high-sensitivity 168  Chest x-ray mild cardiomegaly and mild pulmonary vascular prominence  Impression New onset congestive heart failure Borderline troponins demand ischemia Hypertensive urgency Thyroid nodule Obesity DVT prophylaxis  Plan Agree with admit rule out myocardial infarction Follow-up EKGs and troponins Echocardiogram for assessment of left ventricular function valvular disease wall motion Recommend hypertension controlled with medications Suggest weight loss exercise portion control Consider sleep study for evaluation of obstructive sleep apnea DVT prophylaxis Consider ACE inhibitor beta-blocker diuretics for heart failure symptoms Consider functional study as an outpatient Do not recommend invasive strategy at this point Have the patient follow-up with cardiology as an outpatient

## 2019-04-29 NOTE — Plan of Care (Signed)
Nutrition Education Note  Rd consulted for nutrition education regarding new onset CHF.  Pt reported good appetite. RD provided "Heart Failure Nutrition Therapy" handout from the Academy of [employer] Provided examples on ways to reduce sodium intake. Discouraged intake of processed foods and use of salt shaker, encouraged fresh fruits and vegetables, as well as whole grain sources of carbohydrates to maximize fiber intake. Emphasized role of fluids and importance of weighing self daily. Teach back method used.  Expect good compliance Body mass index is 40.7 kg/m. Pt meets criteria for obese class 3 based on BMI Current diet order is Heart healthy and pt is consuming 100% of meals. Labs and medications reviewed. No further nutrition interventions warranted at this time. If additional nutrition issues arise, please re-consult RD.  Meda Klinefelter, Dietetic Intern

## 2019-04-29 NOTE — Plan of Care (Signed)
  Problem: Education: Goal: Ability to demonstrate management of disease process will improve Outcome: Progressing Goal: Individualized Educational Video(s) Outcome: Progressing   Problem: Activity: Goal: Capacity to carry out activities will improve Outcome: Progressing   Problem: Cardiac: Goal: Ability to achieve and maintain adequate cardiopulmonary perfusion will improve Outcome: Progressing   

## 2019-04-29 NOTE — Progress Notes (Signed)
PROGRESS NOTE    Bryce Brock  GNF:[id number] DOB: [birth year] DOA: 04/28/2019 PCP: Estell Harpin, MD    Brief Narrative:  HPI per Dr. Matthew Saras Warne  is a 63 y.o. obese African-American male with no chronic medical problems, who presented to the emergency room with acute onset of recent exertional dyspnea which have been worsening over the last couple of days with associated paroxysmal nocturnal dyspnea that woke him up from sleep last night and two-pillow orthopnea with mild lower extremity edema.  He earlier he earlier reported chest tightness to the ER physician however denied it during my interview.  No fever or chills.  He admitted to dry cough with the symptoms and occasional wheezing.  No nausea vomiting or abdominal pain.  No leg pain no recent travels or surgeries.  No sick exposures or exposure to COVID-19.  He works at [employer].  Upon presentation to the emergency room, blood pressure was initially 135/80 and later on 145/104 then 154/104, with a temperature of 99.1 and otherwise normal vital signs.  Pulse extremity was 96 to 99% on room air.  Labs revealed elevated BNP of 268 and troponin I of 151 over otherwise unremarkable.  Two-view chest x-ray showed mild cardiomegaly and pulmonary vascular prominence without overt edema or acute airspace opacity.  EKG showed normal sinus rhythm with rate of 67 with Q waves inferiorly and slightly poor R wave progression.  The patient was given 40 mg of IV Lasix.  He will be admitted to telemetry bed for further evaluation and management of new onset acute CHF.   Assessment & Plan:   Principal Problem:   Acute CHF (congestive heart failure) (HCC) Active Problems:   Hypertensive urgency   Elevated troponin   History of thyroid nodule  1 new onset acute CHF exacerbation, currently unspecified Likely secondary to diastolic dysfunction versus right-sided heart failure.  Patient presented with acute onset shortness of breath on  exertion, paroxysmal nocturnal dyspnea, two-pillow orthopnea, mild lower extremity edema, chest tightness.  High-sensitivity troponin elevated at 161.  BNP of 255.  EKG with normal sinus rhythm with no ischemic changes noted.  2D echo pending.  Patient with a urine output of 2.025 L over the past 24 hours.  Patient is -5.12 L during this hospitalization.  We will cycle cardiac enzymes.  Cardiology consulted and are following.  Continue aspirin, Lasix 40 mg IV every 12 hours, lisinopril.  May need beta-blocker however will defer to cardiology.  May need outpatient sleep study.  Cardiology consulted.  2.  Elevated troponin Questionable etiology.  May be secondary to demand ischemia from problem #1.  Cycle serial troponins.  2D echo pending.  CT angiogram chest negative for PE however does show cardiomegaly and dilated main pulmonary artery, trace bilateral pleural effusions, right inferior thyroid nodule measuring 3.3 cm.  Cardiology consulted.  3.  Hypertensive urgency Likely contributing to problem #1.  Patient on IV Lasix and started on low-dose lisinopril with improvement with blood pressure.  Follow.  4.  History of thyroid nodule status post partial thyroidectomy TSH within normal limits.  CT angiogram with a large right inferior thyroid nodule measuring 3.3 cm.  Will need outpatient follow-up with thyroid ultrasound.     DVT prophylaxis: Lovenox Code Status: Full Family Communication: Updated patient.  No family at bedside. Disposition Plan: Likely home when clinically improved, cleared by cardiology hopefully in the next 1 to 2 days.   Consultants:   Cardiology pending  Procedures:  2D echo pending 04/29/2019  CT angiogram chest 04/28/2019  Chest x-ray 04/28/2019  Antimicrobials:   None   Subjective: Feeling some improvement from SOB. No CP.  Echo tech at bedside.  Objective: Vitals:   04/28/19 2034 04/29/19 0352 04/29/19 0356 04/29/19 0730  BP: (!) 151/93 127/83  127/83 121/79  Pulse: 67 73 70 68  Resp: 20 17 17 19   Temp: 98.6 F (37 C) 98.7 F (37.1 C) 98.7 F (37.1 C) 97.8 F (36.6 C)  TempSrc: Oral Oral Oral Oral  SpO2: 97% 96% 98% 95%  Weight: 123.2 kg 121.4 kg    Height: 5\' 8"  (1.727 m)       Intake/Output Summary (Last 24 hours) at 04/29/2019 1346 Last data filed at 04/29/2019 1310 Gross per 24 hour  Intake 3 ml  Output 5125 ml  Net -5122 ml   Filed Weights   04/28/19 2034 04/29/19 0352  Weight: 123.2 kg 121.4 kg    Examination:  General exam: Appears calm and comfortable  Respiratory system: Bibasilar crackles. Respiratory effort normal. Cardiovascular system: S1 & S2 heard, RRR. No JVD, murmurs, rubs, gallops or clicks.  Trace to 1+ bilateral lower extremity edema.   Gastrointestinal system: Abdomen is nondistended, soft and nontender. No organomegaly or masses felt. Normal bowel sounds heard. Central nervous system: Alert and oriented. No focal neurological deficits. Extremities: Symmetric 5 x 5 power. Skin: No rashes, lesions or ulcers Psychiatry: Judgement and insight appear normal. Mood & affect appropriate.     Data Reviewed: I have personally reviewed following labs and imaging studies  CBC: Recent Labs  Lab 04/28/19 1836 04/29/19 0054  WBC 6.3 6.2  NEUTROABS  --  2.5  HGB 12.7* 13.4  HCT 41.1 43.3  MCV 70.1* 69.6*  PLT 190 180   Basic Metabolic Panel: Recent Labs  Lab 04/28/19 1836 04/28/19 2126 04/29/19 0054  NA 138  --  141  K 3.8  --  3.7  CL 106  --  108  CO2 23  --  25  GLUCOSE 99  --  98  BUN 16  --  18  CREATININE 0.95  --  0.89  CALCIUM 8.7*  --  8.9  MG  --  2.2  --    GFR: Estimated Creatinine Clearance: 107.7 mL/min (by C-G formula based on SCr of 0.89 mg/dL). Liver Function Tests: Recent Labs  Lab 04/28/19 1836  AST 20  ALT 29  ALKPHOS 70  BILITOT 0.9  PROT 6.8  ALBUMIN 3.7   No results for input(s): LIPASE, AMYLASE in the last 168 hours. No results for input(s):  AMMONIA in the last 168 hours. Coagulation Profile: No results for input(s): INR, PROTIME in the last 168 hours. Cardiac Enzymes: No results for input(s): CKTOTAL, CKMB, CKMBINDEX, TROPONINI in the last 168 hours. BNP (last 3 results) No results for input(s): PROBNP in the last 8760 hours. HbA1C: No results for input(s): HGBA1C in the last 72 hours. CBG: No results for input(s): GLUCAP in the last 168 hours. Lipid Profile: No results for input(s): CHOL, HDL, LDLCALC, TRIG, CHOLHDL, LDLDIRECT in the last 72 hours. Thyroid Function Tests: Recent Labs    04/28/19 2126  TSH 2.671   Anemia Panel: No results for input(s): VITAMINB12, FOLATE, FERRITIN, TIBC, IRON, RETICCTPCT in the last 72 hours. Sepsis Labs: Recent Labs  Lab 04/28/19 2127 04/29/19 0054  LATICACIDVEN 1.2 1.0    Recent Results (from the past 240 hour(s))  SARS CORONAVIRUS 2 (TAT 6-24 HRS) Nasopharyngeal Nasopharyngeal Swab  Status: None   Collection Time: 04/28/19  7:56 PM   Specimen: Nasopharyngeal Swab  Result Value Ref Range Status   SARS Coronavirus 2 NEGATIVE NEGATIVE Final    Comment: (NOTE) SARS-CoV-2 target nucleic acids are NOT DETECTED. The SARS-CoV-2 RNA is generally detectable in upper and lower respiratory specimens during the acute phase of infection. Negative results do not preclude SARS-CoV-2 infection, do not rule out co-infections with other pathogens, and should not be used as the sole basis for treatment or other patient management decisions. Negative results must be combined with clinical observations, patient history, and epidemiological information. The expected result is Negative. Fact Sheet for Patients: HairSlick.no Fact Sheet for Healthcare Providers: quierodirigir.com This test is not yet approved or cleared by the [place] FDA and  has been authorized for detection and/or diagnosis of SARS-CoV-2 by FDA under an  Emergency Use Authorization (EUA). This EUA will remain  in effect (meaning this test can be used) for the duration of the COVID-19 declaration under Section 56 4(b)(1) of the Act, 21 U.S.C. section 360bbb-3(b)(1), unless the authorization is terminated or revoked sooner. Performed at [facility], [address], [place], [place] [address]          Radiology Studies: Dg Chest 2 View  Result Date: 04/28/2019 CLINICAL DATA:  Shortness of breath EXAM: CHEST - 2 VIEW COMPARISON:  11/02/2004 FINDINGS: Mild cardiomegaly. Pulmonary vascular prominence. Disc degenerative disease of the thoracic spine. IMPRESSION: Mild cardiomegaly and pulmonary vascular prominence without overt edema or acute appearing airspace opacity. Electronically Signed   By: Lauralyn Primes M.D.   On: 04/28/2019 17:28   Ct Angio Chest Pe W Or Wo Contrast  Result Date: 04/28/2019 CLINICAL DATA:  Shortness of breath. EXAM: CT ANGIOGRAPHY CHEST WITH CONTRAST TECHNIQUE: Multidetector CT imaging of the chest was performed using the standard protocol during bolus administration of intravenous contrast. Multiplanar CT image reconstructions and MIPs were obtained to evaluate the vascular anatomy. CONTRAST:  82mL OMNIPAQUE IOHEXOL 350 MG/ML SOLN COMPARISON:  None. FINDINGS: Cardiovascular: Evaluation is somewhat limited by respiratory motion artifact.Given this limitation, no pulmonary embolism was detected. The main pulmonary artery is dilated measuring approximately 4.2 cm in diameter. The heart size is enlarged. There is no significant pericardial effusion. Mediastinum/Nodes: --No mediastinal or hilar lymphadenopathy. --No axillary lymphadenopathy. --No supraclavicular lymphadenopathy. --there is a large partially visualized right inferior thyroid nodule measuring approximately 3.3 cm. Portions of this nodule or posterior to the proximal clavicle. This nodule shift of the trachea to the left. --The esophagus is unremarkable  Lungs/Pleura: There is a mildly mosaic appearance of the lung parenchyma bilaterally. There is no focal infiltrate. There are trace bilateral pleural effusions. Upper Abdomen: No acute abnormality. There is probable underlying hepatic steatosis. Musculoskeletal: No chest wall abnormality. No acute or significant osseous findings. There is bilateral gynecomastia that appears symmetric. Review of the MIP images confirms the above findings. IMPRESSION: 1. Study is somewhat limited by respiratory motion artifact. Given this limitation, no pulmonary embolism was detected. 2. Dilated main pulmonary artery, can be seen with pulmonary arterial hypertension. 3. Cardiomegaly with trace bilateral pleural effusions. 4. Large partially visualized right inferior thyroid nodule measuring approximately 3.3 cm. Recommend further evaluation with a nonemergent outpatient thyroid ultrasound. If patient is clinically hyperthyroid, consider nuclear medicine thyroid uptake and scan. 5. Mosaic appearance of the lung parenchyma. This is nonspecific but can be seen in patients with pulmonary artery hypertension or small airway disease. Electronically Signed   By: Cristal Deer  Green M.D.   On: 04/28/2019 22:03        Scheduled Meds:  aspirin EC  81 mg Oral Daily   enoxaparin (LOVENOX) injection  40 mg Subcutaneous Q12H   furosemide  40 mg Intravenous Q12H   lisinopril  5 mg Oral Daily   sodium chloride flush  3 mL Intravenous Q12H   Continuous Infusions:  sodium chloride       LOS: 1 day    Time spent: 40 minutes    Irine Seal, MD [facility]  If 7PM-7AM, please contact night-coverage [email or web address] 04/29/2019, 1:46 PM

## 2019-04-30 DIAGNOSIS — I5031 Acute diastolic (congestive) heart failure: Secondary | ICD-10-CM

## 2019-04-30 LAB — BASIC METABOLIC PANEL
Anion gap: 8 (ref 5–15)
BUN: 19 mg/dL (ref 8–23)
CO2: 27 mmol/L (ref 22–32)
Calcium: 9.4 mg/dL (ref 8.9–10.3)
Chloride: 105 mmol/L (ref 98–111)
Creatinine, Ser: 0.97 mg/dL (ref 0.61–1.24)
GFR calc Af Amer: >60 mL/min (ref >60)
GFR calc non Af Amer: >60 mL/min (ref >60)
Glucose, Bld: 105 mg/dL — ABNORMAL HIGH (ref 70–99)
Potassium: 3.9 mmol/L (ref 3.5–5.1)
Sodium: 140 mmol/L (ref 135–145)

## 2019-04-30 LAB — CBC
HCT: 45.7 % (ref 39.0–52.0)
Hemoglobin: 14.1 g/dL (ref 13.0–17.0)
MCH: 21.3 pg — ABNORMAL LOW (ref 26.0–34.0)
MCHC: 30.9 g/dL (ref 30.0–36.0)
MCV: 69 fL — ABNORMAL LOW (ref 80.0–100.0)
Platelets: 184 K/uL (ref 150–400)
RBC: 6.62 MIL/uL — ABNORMAL HIGH (ref 4.22–5.81)
RDW: 18.3 % — ABNORMAL HIGH (ref 11.5–15.5)
WBC: 6.6 K/uL (ref 4.0–10.5)
nRBC: 0 % (ref 0.0–0.2)

## 2019-04-30 LAB — TROPONIN I (HIGH SENSITIVITY): Troponin I (High Sensitivity): 127 ng/L

## 2019-04-30 LAB — MAGNESIUM: Magnesium: 2.4 mg/dL (ref 1.7–2.4)

## 2019-04-30 MED ORDER — ASPIRIN 81 MG PO TBEC: 81.0000 mg | DELAYED_RELEASE_TABLET | Freq: Every day | ORAL | Status: DC

## 2019-04-30 MED ORDER — LISINOPRIL 5 MG PO TABS: 5.0000 mg | ORAL_TABLET | Freq: Every day | ORAL | 1 refills | Status: DC

## 2019-04-30 MED ORDER — ATORVASTATIN CALCIUM 20 MG PO TABS: 20.0000 mg | ORAL_TABLET | Freq: Every day | ORAL | Status: DC

## 2019-04-30 MED ORDER — ACETAMINOPHEN 325 MG PO TABS: 650.0000 mg | ORAL_TABLET | ORAL | Status: DC | PRN

## 2019-04-30 MED ORDER — METOPROLOL SUCCINATE ER 25 MG PO TB24
25.0000 mg | ORAL_TABLET | Freq: Every day | ORAL | Status: DC
  Administered 2019-04-30: 10:00:00 25 mg via ORAL
  Filled 2019-04-30: qty 1

## 2019-04-30 MED ORDER — METOPROLOL SUCCINATE ER 25 MG PO TB24: 25.0000 mg | ORAL_TABLET | Freq: Every day | ORAL | 1 refills | Status: DC

## 2019-04-30 MED ORDER — ATORVASTATIN CALCIUM 20 MG PO TABS: 20.0000 mg | ORAL_TABLET | Freq: Every day | ORAL | 1 refills | Status: AC

## 2019-04-30 MED ORDER — FUROSEMIDE 20 MG PO TABS: 20.0000 mg | ORAL_TABLET | Freq: Every day | ORAL | 1 refills | Status: DC

## 2019-04-30 NOTE — Progress Notes (Signed)
[facility]  SUBJECTIVE: Patient states he feels reasonably well no chest pain or shortness of breath with ambulation.  Still on telemetry eager to go home no leg swelling no palpitations or tachycardia blood pressure is reasonably controlled   Vitals:   04/29/19 1628 04/29/19 1930 04/30/19 0532 04/30/19 0847  BP: 130/74 105/69 120/84 112/83  Pulse: 72 65 61 64  Resp: 20   18  Temp: 98 F (36.7 C) 98 F (36.7 C) 97.9 F (36.6 C)   TempSrc:      SpO2: 96% 96% 99% 98%  Weight:   120.5 kg   Height:         Intake/Output Summary (Last 24 hours) at 04/30/2019 1244 Last data filed at 04/30/2019 2376 Gross per 24 hour  Intake 120 ml  Output 3775 ml  Net -3655 ml      PHYSICAL EXAM  General: Well developed, well nourished, in no acute distress HEENT:  Normocephalic and atramatic Neck:  No JVD.  Lungs: Clear bilaterally to auscultation and percussion. Heart: HRRR . Normal S1 and S2 without gallops or murmurs.  Abdomen: Bowel sounds are positive, abdomen soft and non-tender  Msk:  Back normal, normal gait. Normal strength and tone for age. Extremities: No clubbing, cyanosis or edema.   Neuro: Alert and oriented X 3. Psych:  Good affect, responds appropriately   LABS: Basic Metabolic Panel: Recent Labs    04/28/19 2126 04/29/19 0054 04/30/19 0600  NA  --  141 140  K  --  3.7 3.9  CL  --  108 105  CO2  --  25 27  GLUCOSE  --  98 105*  BUN  --  18 19  CREATININE  --  0.89 0.97  CALCIUM  --  8.9 9.4  MG 2.2  --  2.4   Liver Function Tests: Recent Labs    04/28/19 1836  AST 20  ALT 29  ALKPHOS 70  BILITOT 0.9  PROT 6.8  ALBUMIN 3.7   No results for input(s): LIPASE, AMYLASE in the last 72 hours. CBC: Recent Labs    04/29/19 0054 04/30/19 0600  WBC 6.2 6.6  NEUTROABS 2.5  --   HGB 13.4 14.1  HCT 43.3 45.7  MCV 69.6* 69.0*  PLT 180 184   Cardiac Enzymes: No results for input(s): CKTOTAL, CKMB, CKMBINDEX, TROPONINI in the last 72  hours. BNP: Invalid input(s): POCBNP D-Dimer: No results for input(s): DDIMER in the last 72 hours. Hemoglobin A1C: No results for input(s): HGBA1C in the last 72 hours. Fasting Lipid Panel: No results for input(s): CHOL, HDL, LDLCALC, TRIG, CHOLHDL, LDLDIRECT in the last 72 hours. Thyroid Function Tests: Recent Labs    04/28/19 2126  TSH 2.671   Anemia Panel: No results for input(s): VITAMINB12, FOLATE, FERRITIN, TIBC, IRON, RETICCTPCT in the last 72 hours.  Dg Chest 2 View  Result Date: 04/28/2019 CLINICAL DATA:  Shortness of breath EXAM: CHEST - 2 VIEW COMPARISON:  11/02/2004 FINDINGS: Mild cardiomegaly. Pulmonary vascular prominence. Disc degenerative disease of the thoracic spine. IMPRESSION: Mild cardiomegaly and pulmonary vascular prominence without overt edema or acute appearing airspace opacity. Electronically Signed   By: Eddie Candle M.D.   On: 04/28/2019 17:28   Ct Angio Chest Pe W Or Wo Contrast  Result Date: 04/28/2019 CLINICAL DATA:  Shortness of breath. EXAM: CT ANGIOGRAPHY CHEST WITH CONTRAST TECHNIQUE: Multidetector CT imaging of the chest was performed using the standard protocol during bolus administration of intravenous contrast. Multiplanar CT image reconstructions  and MIPs were obtained to evaluate the vascular anatomy. CONTRAST:  96mL OMNIPAQUE IOHEXOL 350 MG/ML SOLN COMPARISON:  None. FINDINGS: Cardiovascular: Evaluation is somewhat limited by respiratory motion artifact.Given this limitation, no pulmonary embolism was detected. The main pulmonary artery is dilated measuring approximately 4.2 cm in diameter. The heart size is enlarged. There is no significant pericardial effusion. Mediastinum/Nodes: --No mediastinal or hilar lymphadenopathy. --No axillary lymphadenopathy. --No supraclavicular lymphadenopathy. --there is a large partially visualized right inferior thyroid nodule measuring approximately 3.3 cm. Portions of this nodule or posterior to the proximal  clavicle. This nodule shift of the trachea to the left. --The esophagus is unremarkable Lungs/Pleura: There is a mildly mosaic appearance of the lung parenchyma bilaterally. There is no focal infiltrate. There are trace bilateral pleural effusions. Upper Abdomen: No acute abnormality. There is probable underlying hepatic steatosis. Musculoskeletal: No chest wall abnormality. No acute or significant osseous findings. There is bilateral gynecomastia that appears symmetric. Review of the MIP images confirms the above findings. IMPRESSION: 1. Study is somewhat limited by respiratory motion artifact. Given this limitation, no pulmonary embolism was detected. 2. Dilated main pulmonary artery, can be seen with pulmonary arterial hypertension. 3. Cardiomegaly with trace bilateral pleural effusions. 4. Large partially visualized right inferior thyroid nodule measuring approximately 3.3 cm. Recommend further evaluation with a nonemergent outpatient thyroid ultrasound. If patient is clinically hyperthyroid, consider nuclear medicine thyroid uptake and scan. 5. Mosaic appearance of the lung parenchyma. This is nonspecific but can be seen in patients with pulmonary artery hypertension or small airway disease. Electronically Signed   By: Katherine Mantle M.D.   On: 04/28/2019 22:03     Echo preserved left ventricular function around 50% with evidence of inferior hypokinesis concerning for possible angina  TELEMETRY: Normal sinus rhythm no significant evidence of arrhythmia:  ASSESSMENT AND PLAN:  Principal Problem:   Acute CHF (congestive heart failure) (HCC) Active Problems:   Hypertensive urgency   Elevated troponin   History of thyroid nodule    1.  Plan Agree with telemetry for further assessment evaluation patient appears to be ready for discharge Recommend blood pressure management and control as outpatient Heart failure therapy ACE inhibitor beta-blocker Statin therapy as outpatient Low-dose  diuretic therapy to help with shortness of breath Consider sleep study for evaluation of possible obstructive sleep apnea Outpatient cardiology follow-up early next week Monday morning with [facility] Consider outpatient functional study for assessment of possible coronary disease   Alwyn Pea, MD, 04/30/2019 12:44 PM

## 2019-04-30 NOTE — Clinical Social Work Note (Signed)
CSW acknowledges consult for heart failure screen. CSW discussed case with Bryce Epley, RN with cardiac rehab. Patient works full time, is not home bound. He has a scale at home and has been given education on weighing daily.  Patient has orders to discharge home today. CSW signing off.  Dayton Scrape, [place]

## 2019-04-30 NOTE — Progress Notes (Addendum)
Cardiovascular and Pulmonary Nurse Navigator Note:    63 year old [place] American male with no chronic medical problems who presented tot he ER with acute onset of exertional dyspnea.  Patient admitted with dx of scute CHF, hypertensive urgency and elevated troponin.  BNP on admission 255 (BMI 40.40).  High Sensitivity Troponin 151 / 161 / 137 / 149 / 127.  CXR:  IMPRESSION: Mild cardiomegaly and pulmonary vascular prominence without overt edema or acute appearing airspace opacity.    CT ANGIOGRAPHY CHEST WITH CONTRAST IMPRESSION: 1. Study is somewhat limited by respiratory motion artifact. Given this limitation, no pulmonary embolism was detected. 2. Dilated main pulmonary artery, can be seen with pulmonary arterial hypertension. 3. Cardiomegaly with trace bilateral pleural effusions. 4. Large partially visualized right inferior thyroid nodule measuring approximately 3.3 cm. Recommend further evaluation with a nonemergent outpatient thyroid ultrasound. If patient is clinically hyperthyroid, consider nuclear medicine thyroid uptake and scan. 5. Mosaic appearance of the lung parenchyma. This is nonspecific but can be seen in patients with pulmonary artery hypertension or small airway disease.  Result status: Final result    ECHOCARDIOGRAM REPORT    Patient Name:   Bryce Brock Date of Exam: 04/29/2019 Medical Rec #:  [id number]         Height:       68.0 in Accession #:    [id number]        Weight:       267.7 lb Date of Birth:  May 01, 1956         Diagnosing Phys: Alwyn Pea MD  IMPRESSIONS  1. Left ventricular ejection fraction, by visual estimation, is 50 to 55%. The left ventricle has low normal function. Left ventricular septal wall thickness was normal. Normal left ventricular posterior wall thickness. There is no left ventricular  hypertrophy.  2. Left ventricular diastolic parameters are consistent with Grade I diastolic dysfunction (impaired relaxation).  3.  Global right ventricle has normal systolic function.The right ventricular size is normal. No increase in right ventricular wall thickness.  4. Left atrial size was normal.  5. Right atrial size was normal.  6. The mitral valve is normal in structure. Mild to moderate mitral valve regurgitation.  7. The tricuspid valve is normal in structure. Tricuspid valve regurgitation is mild.  8. The aortic valve is normal in structure. Aortic valve regurgitation is not visualized.  9. The pulmonic valve was grossly normal. Pulmonic valve regurgitation is not visualized. 10. Normal pulmonary artery systolic pressure. 11. Inferior wall Hypokinesis.     EDUCATION:   Rounded on patient.  Patient sitting up in recliner chair finishing his lunch.   ? Educational session with patient / wife completed.  Reviewed  Educational booklet, "Living Better with Heart Failure."  Briefly reviewed definition of heart failure and signs and symptoms of an exacerbation. Potential causes of HF discussed. Explained to patient that HF is a chronic illness which requires self-assessment / self-management along with help from the cardiologist/PCP.?Discussed EF value, his EF and normal EF. Patient's EF was 50-55%.  (Patient to have stress test as an outpatient.)  Patient's symptoms of HF were: SOB and 1 + bilateral lower extremity swelling.  *Reviewed importance of and reason behind checking weight daily in the AM, after using the bathroom, but before getting dressed. Patient has functioning scale and plans to start weighing himself daily.  *Reviewed with patient the following information:  *Discussed when to call the Dr= weight gain of >2-3lb overnight of 5lb in  a week,  *Discussed yellow zone= call MD: weight gain of >2-3lb overnight of 5lb in a week, increased swelling, increased SOB when lying down, chest discomfort, dizziness, increased fatigue  *Red Zone= call 911: struggle to breath, fainting or near fainting,  significant chest pain  *Heart Failure Zone magnet given and reviewed with patient.  *Diet - Reviewed low sodium diet-provided handout of recommended and not recommended foods. Dietitian Consultation for diet education has been completed this admission.   Handouts on Sodium Content of Foods as well as Heart Failure Nutrition Therapy given to patient and wife.    *Discussed fluid intake with patient as well. Patient not currently on a fluid restriction, but advised no more than 64 ounces of fluid per day.?  *Instructed patient to take medications as prescribed for heart failure. Explained briefly why pt is on the medications (either make you feel better, live longer or keep you out of the hospital) and discussed monitoring and side effects.  *Discussed exercise. Patient does not currently exercise. Patient is physically active on his job at the [employer]. Shared with patient the importance of exercise. Explained patient is a candidate for Pulmonary Rehab given his  EF and Diastolic HF diagnosis. Patient to have an outpatient stress test.  Explained to patient it would be best to start pulmonary rehab after stress test.  Patient and wife in agreement.    *Smoking Cessation- Patient is a NEVER smoker.    *[facility] - Explained the purpose of the HF Clinic. ?Explained to patient the HF Clinic does not replace his PCP nor Cardiologist, but is an additional resource to helping patient manage heart failure at home. Explained the rationale for being seen in this clinic. Patient has an appointment in the [facility] on 05/08/2019 at 2:00 p.m.    Again, the 5 Steps to Living Better with Heart Failure were reviewed with patient.  Patient / wife appreciative of the above information.    Roanna Epley, RN, BSN, [facility]  Cardiovascular & Pulmonary Nurse Navigator  Direct Line: [phone]  Department Phone #: [phone] Fax: [phone]   Email Address: [email or web address]    ?

## 2019-04-30 NOTE — Discharge Summary (Signed)
Physician Discharge Summary  Bryce Brock UJW:[id number] DOB: Sep 02, 1955 DOA: 04/28/2019  PCP: Estell Harpin, MD  Admit date: 04/28/2019 Discharge date: 04/30/2019  Time spent: 60 minutes  Recommendations for Outpatient Follow-up:  1. Follow-up with Dr. Juliann Pares, cardiology on Monday, 05/04/2019. 2. Follow-up at [facility] cardiac and pulmonary rehab. 3. Follow-up with Estell Harpin, MD in 2 weeks.  On follow-up patient will need basic metabolic profile done to follow-up on electrolytes and renal function.  Patient may benefit from outpatient referral for sleep study to rule out OSA. 4. Follow-up at [facility] heart failure clinic on 05/08/2019.   Discharge Diagnoses:  Principal Problem:   Acute diastolic CHF (congestive heart failure) (HCC) Active Problems:   Hypertensive urgency   Elevated troponin   History of thyroid nodule   Discharge Condition: Stable and improved  Diet recommendation: Heart healthy  Filed Weights   04/28/19 2034 04/29/19 0352 04/30/19 0532  Weight: 123.2 kg 121.4 kg 120.5 kg    History of present illness:  HPI per Dr. Matthew Saras Grassia  is a 63 y.o. obese African-American male with no chronic medical problems, who presented to the emergency room with acute onset of recent exertional dyspnea which have been worsening over the last couple of days with associated paroxysmal nocturnal dyspnea that woke him up from sleep last night and two-pillow orthopnea with mild lower extremity edema.  He earlier reported chest tightness to the ER physician however denied it during my interview.  No fever or chills.  He admitted to dry cough with the symptoms and occasional wheezing.  No nausea vomiting or abdominal pain.  No leg pain no recent travels or surgeries.  No sick exposures or exposure to COVID-19.  He works at [employer].  Upon presentation to the emergency room, blood pressure was initially 135/80 and later on 145/104 then 154/104, with a  temperature of 99.1 and otherwise normal vital signs.  Pulse extremity was 96 to 99% on room air.  Labs revealed elevated BNP of 268 and troponin I of 151 over otherwise unremarkable.  Two-view chest x-ray showed mild cardiomegaly and pulmonary vascular prominence without overt edema or acute airspace opacity.  EKG showed normal sinus rhythm with rate of 67 with Q waves inferiorly and slightly poor R wave progression.  The patient was given 40 mg of IV Lasix.  He will be admitted to telemetry bed for further evaluation and management of new onset acute CHF.  Hospital Course:  1 new onset acute diastolic CHF exacerbation Likely secondary to diastolic dysfunction versus right-sided heart failure.  Patient presented with acute onset shortness of breath on exertion, paroxysmal nocturnal dyspnea, two-pillow orthopnea, mild lower extremity edema, chest tightness.  High-sensitivity troponin elevated at 161.  BNP of 255.  EKG with normal sinus rhythm with no ischemic changes noted.  2D echo was done which showed EF of 50 to 55%, grade 1 diastolic dysfunction, global right ventricular normal systolic function, inferior wall hypokinesis.  Patient was diuresed with IV Lasix with good urine output and was -8.4 to 7 L during the hospitalization.  Cardiac enzymes which were cycled were elevated however trended down likely elevated due to demand ischemia.  Patient's weight on discharge was 265.7 pounds from 271.5 pounds on day of admission.  Patient was placed on a beta-blocker, ACE inhibitor, aspirin and a statin.  Patient was followed by cardiology during the hospitalization and cardiology recommended close outpatient follow-up for evaluation for functional study to rule out coronary disease.  Patient may also benefit from outpatient sleep study.  Patient was discharged in stable and improved condition.  2.  Elevated troponin Questionable etiology.  May be secondary to demand ischemia from problem #1.  Troponins were  cycled which remained elevated but started to trend down.  Patient did not have any further chest pain during the hospitalization. CT angiogram chest negative for PE however did show cardiomegaly and dilated main pulmonary artery, trace bilateral pleural effusions, right inferior thyroid nodule measuring 3.3 cm.  2D echo which was obtained  showed a EF of 50 to 55%, grade 1 diastolic dysfunction, global right ventricular normal systolic function, inferior wall hypokinesis.  Patient placed on beta-blocker, ACE inhibitor diuretic aspirin and a statin.  Cardiology was consulted and followed the patient throughout the hospitalization and recommended outpatient cardiology follow-up on Monday, 05/04/2019.  On follow-up decision will be made on outpatient functional study for assessment of possible coronary disease.  Patient was discharged home in stable and improved condition.  3.  Hypertensive urgency Likely contributing to problem #1.  Patient initially was placed on IV Lasix and started on low-dose lisinopril.  Patient had improvement with blood pressure.  Metoprolol was added to patient's regimen for better blood pressure control.  Blood pressure improved.  Outpatient follow-up.   4.  History of thyroid nodule status post partial thyroidectomy TSH within normal limits.  CT angiogram with a large right inferior thyroid nodule measuring 3.3 cm.  Will need outpatient follow-up with thyroid ultrasound.   Procedures:  2D echo  04/29/2019  CT angiogram chest 04/28/2019  Chest x-ray 04/28/2019  Consultations: Cardiology: Dr. Juliann Pares 04/29/2019  Discharge Exam: Vitals:   04/30/19 0532 04/30/19 0847  BP: 120/84 112/83  Pulse: 61 64  Resp:  18  Temp: 97.9 F (36.6 C)   SpO2: 99% 98%    General: NAD Cardiovascular: RRR Respiratory: CTAB  Discharge Instructions   Discharge Instructions    AMB referral to pulmonary rehabilitation   Complete by: As directed    Please select a program:  Respiratory Care Services   Respiratory Care Services Diagnosis: Heart Failure Comment - Diastolic HF   After initial evaluation and assessments completed: Virtual Based Care may be provided alone or in conjunction with Pulmonary Rehab/Respiratory Care services based on patient barriers.: Yes   Amb Referral to HF Clinic   Complete by: As directed    Diet - low sodium heart healthy   Complete by: As directed    Increase activity slowly   Complete by: As directed      Allergies as of 04/30/2019   No Known Allergies     Medication List    STOP taking these medications   naproxen sodium 220 MG tablet Commonly known as: ALEVE     TAKE these medications   acetaminophen 325 MG tablet Commonly known as: TYLENOL Take 2 tablets (650 mg total) by mouth every 4 (four) hours as needed for headache or mild pain.   aspirin 81 MG EC tablet Take 1 tablet (81 mg total) by mouth daily. Start taking on: May 01, 2019   atorvastatin 20 MG tablet Commonly known as: LIPITOR Take 1 tablet (20 mg total) by mouth daily at 6 PM.   furosemide 20 MG tablet Commonly known as: Lasix Take 1 tablet (20 mg total) by mouth daily.   lisinopril 5 MG tablet Commonly known as: ZESTRIL Take 1 tablet (5 mg total) by mouth daily. Start taking on: May 01, 2019   metoprolol succinate 25  MG 24 hr tablet Commonly known as: TOPROL-XL Take 1 tablet (25 mg total) by mouth daily. Start taking on: May 01, 2019      No Known Allergies Follow-up Information    [facility] Follow up on 05/08/2019.   Specialty: Cardiology Why: at 2:00pm. Please enter through the [place] entrance Contact information: [place] [phone]       [facility] Cardiac and Pulmonary Rehab Follow up.   Specialty: Cardiac Rehabilitation Why: Your Cardiologist has referred you to outpatient Pulmonary Rehab at [facility].  The  Pulmonary Rehab department will contact you within one to two weeks after discharge to schedule your first appointment.   Contact information: [place] [id number] [address] [place]       Yolonda Kida, MD Follow up.   Specialties: Cardiology, Internal Medicine Why: Needs to follow-up on Monday, 05/04/2019 Contact information: [place] [address] [phone]        Maeola Sarah, MD. Schedule an appointment as soon as possible for a visit in 2 week(s).   Specialty: Family Medicine Contact information: [place] [address] [phone]            The results of significant diagnostics from this hospitalization (including imaging, microbiology, ancillary and laboratory) are listed below for reference.    Significant Diagnostic Studies: Dg Chest 2 View  Result Date: 04/28/2019 CLINICAL DATA:  Shortness of breath EXAM: CHEST - 2 VIEW COMPARISON:  11/02/2004 FINDINGS: Mild cardiomegaly. Pulmonary vascular prominence. Disc degenerative disease of the thoracic spine. IMPRESSION: Mild cardiomegaly and pulmonary vascular prominence without overt edema or acute appearing airspace opacity. Electronically Signed   By: Eddie Candle M.D.   On: 04/28/2019 17:28   Ct Angio Chest Pe W Or Wo Contrast  Result Date: 04/28/2019 CLINICAL DATA:  Shortness of breath. EXAM: CT ANGIOGRAPHY CHEST WITH CONTRAST TECHNIQUE: Multidetector CT imaging of the chest was performed using the standard protocol during bolus administration of intravenous contrast. Multiplanar CT image reconstructions and MIPs were obtained to evaluate the vascular anatomy. CONTRAST:  27mL OMNIPAQUE IOHEXOL 350 MG/ML SOLN COMPARISON:  None. FINDINGS: Cardiovascular: Evaluation is somewhat limited by respiratory motion artifact.Given this limitation, no pulmonary embolism was detected. The main pulmonary artery is dilated measuring  approximately 4.2 cm in diameter. The heart size is enlarged. There is no significant pericardial effusion. Mediastinum/Nodes: --No mediastinal or hilar lymphadenopathy. --No axillary lymphadenopathy. --No supraclavicular lymphadenopathy. --there is a large partially visualized right inferior thyroid nodule measuring approximately 3.3 cm. Portions of this nodule or posterior to the proximal clavicle. This nodule shift of the trachea to the left. --The esophagus is unremarkable Lungs/Pleura: There is a mildly mosaic appearance of the lung parenchyma bilaterally. There is no focal infiltrate. There are trace bilateral pleural effusions. Upper Abdomen: No acute abnormality. There is probable underlying hepatic steatosis. Musculoskeletal: No chest wall abnormality. No acute or significant osseous findings. There is bilateral gynecomastia that appears symmetric. Review of the MIP images confirms the above findings. IMPRESSION: 1. Study is somewhat limited by respiratory motion artifact. Given this limitation, no pulmonary embolism was detected. 2. Dilated main pulmonary artery, can be seen with pulmonary arterial hypertension. 3. Cardiomegaly with trace bilateral pleural effusions. 4. Large partially visualized right inferior thyroid nodule measuring approximately 3.3 cm. Recommend further evaluation with a nonemergent outpatient thyroid ultrasound. If patient is clinically hyperthyroid, consider nuclear medicine thyroid uptake  and scan. 5. Mosaic appearance of the lung parenchyma. This is nonspecific but can be seen in patients with pulmonary artery hypertension or small airway disease. Electronically Signed   By: Katherine Mantlehristopher  Green M.D.   On: 04/28/2019 22:03    Microbiology: Recent Results (from the past 240 hour(s))  SARS CORONAVIRUS 2 (TAT 6-24 HRS) Nasopharyngeal Nasopharyngeal Swab     Status: None   Collection Time: 04/28/19  7:56 PM   Specimen: Nasopharyngeal Swab  Result Value Ref Range Status   SARS  Coronavirus 2 NEGATIVE NEGATIVE Final    Comment: (NOTE) SARS-CoV-2 target nucleic acids are NOT DETECTED. The SARS-CoV-2 RNA is generally detectable in upper and lower respiratory specimens during the acute phase of infection. Negative results do not preclude SARS-CoV-2 infection, do not rule out co-infections with other pathogens, and should not be used as the sole basis for treatment or other patient management decisions. Negative results must be combined with clinical observations, patient history, and epidemiological information. The expected result is Negative. Fact Sheet for Patients: HairSlick.nohttps://www.fda.gov/media/138098/download Fact Sheet for Healthcare Providers: quierodirigir.comhttps://www.fda.gov/media/138095/download This test is not yet approved or cleared by the [place] FDA and  has been authorized for detection and/or diagnosis of SARS-CoV-2 by FDA under an Emergency Use Authorization (EUA). This EUA will remain  in effect (meaning this test can be used) for the duration of the COVID-19 declaration under Section 56 4(b)(1) of the Act, 21 U.S.C. section 360bbb-3(b)(1), unless the authorization is terminated or revoked sooner. Performed at [facility], [address] [place], [place] [address]      Labs: Basic Metabolic Panel: Recent Labs  Lab 04/28/19 1836 04/28/19 2126 04/29/19 0054 04/30/19 0600  NA 138  --  141 140  K 3.8  --  3.7 3.9  CL 106  --  108 105  CO2 23  --  25 27  GLUCOSE 99  --  98 105*  BUN 16  --  18 19  CREATININE 0.95  --  0.89 0.97  CALCIUM 8.7*  --  8.9 9.4  MG  --  2.2  --  2.4   Liver Function Tests: Recent Labs  Lab 04/28/19 1836  AST 20  ALT 29  ALKPHOS 70  BILITOT 0.9  PROT 6.8  ALBUMIN 3.7   No results for input(s): LIPASE, AMYLASE in the last 168 hours. No results for input(s): AMMONIA in the last 168 hours. CBC: Recent Labs  Lab 04/28/19 1836 04/29/19 0054 04/30/19 0600  WBC 6.3 6.2 6.6  NEUTROABS  --  2.5  --    HGB 12.7* 13.4 14.1  HCT 41.1 43.3 45.7  MCV 70.1* 69.6* 69.0*  PLT 190 180 184   Cardiac Enzymes: No results for input(s): CKTOTAL, CKMB, CKMBINDEX, TROPONINI in the last 168 hours. BNP: BNP (last 3 results) Recent Labs    04/28/19 1812 04/29/19 0055  BNP 268.0* 255.0*    ProBNP (last 3 results) No results for input(s): PROBNP in the last 8760 hours.  CBG: No results for input(s): GLUCAP in the last 168 hours.     Signed:  Ramiro Harvestaniel Aleshka Corney MD.  [facility] 04/30/2019, 1:54 PM

## 2019-05-08 ENCOUNTER — Telehealth: Payer: Self-pay | Admitting: Family

## 2019-05-08 ENCOUNTER — Ambulatory Visit: Payer: Commercial Managed Care - PPO | Admitting: Family

## 2019-05-08 NOTE — Progress Notes (Deleted)
   Patient ID: Bryce Brock, male    DOB: 07-01-1955, 63 y.o.   MRN: [id number]  HPI  [name] is a 63 y/o male with a history of  Echo report from 04/29/2019 reviewed and showed an EF of 50-55% along with mild/moderate MR and mild TR.   Admitted 04/30/2019 due to new onset heart failure. Cardiology consult obtained. Initially given IV lasix and then transitioned to oral diuretics. Elevated troponin thought to possibly be due to demand ischemia. Discharged after 2 days.   He presents today for his initial visit with a chief complaint of  Review of Systems    Physical Exam    Assessment & Plan:  1: Chronic heart failure with preserved ejection fraction- - NYHA class - BNP 04/29/2019 was 255.0  2: HTN- - BP - BMP 04/30/2019 reviewed and showed sodium 140, potassium 3.9, creatinine 0.97 and GFR >60

## 2019-05-08 NOTE — Telephone Encounter (Signed)
Patient did not show for his Heart Failure Clinic appointment on 05/08/2019. Will attempt to reschedule.  

## 2019-05-17 ENCOUNTER — Emergency Department
Admission: EM | Admit: 2019-05-17 | Discharge: 2019-05-17 | Disposition: A | Discharge status: Home / Self Care | Payer: Commercial Managed Care - PPO | Attending: Emergency Medicine | Admitting: Emergency Medicine

## 2019-05-17 ENCOUNTER — Encounter: Payer: Self-pay | Admitting: Intensive Care

## 2019-05-17 ENCOUNTER — Emergency Department: Payer: Commercial Managed Care - PPO

## 2019-05-17 ENCOUNTER — Other Ambulatory Visit: Payer: Self-pay

## 2019-05-17 DIAGNOSIS — R0602 Shortness of breath: Secondary | ICD-10-CM | POA: Diagnosis present

## 2019-05-17 DIAGNOSIS — Z7982 Long term (current) use of aspirin: Secondary | ICD-10-CM | POA: Insufficient documentation

## 2019-05-17 DIAGNOSIS — Z79899 Other long term (current) drug therapy: Secondary | ICD-10-CM | POA: Insufficient documentation

## 2019-05-17 DIAGNOSIS — I509 Heart failure, unspecified: Secondary | ICD-10-CM

## 2019-05-17 DIAGNOSIS — I110 Hypertensive heart disease with heart failure: Secondary | ICD-10-CM | POA: Insufficient documentation

## 2019-05-17 DIAGNOSIS — Z20828 Contact with and (suspected) exposure to other viral communicable diseases: Secondary | ICD-10-CM | POA: Insufficient documentation

## 2019-05-17 DIAGNOSIS — I5033 Acute on chronic diastolic (congestive) heart failure: Secondary | ICD-10-CM | POA: Insufficient documentation

## 2019-05-17 HISTORY — DX: Heart failure, unspecified: I50.9

## 2019-05-17 LAB — COMPREHENSIVE METABOLIC PANEL
ALT: 51 U/L — ABNORMAL HIGH (ref 0–44)
AST: 23 U/L (ref 15–41)
Albumin: 3.9 g/dL (ref 3.5–5.0)
Alkaline Phosphatase: 58 U/L (ref 38–126)
Anion gap: 10 (ref 5–15)
BUN: 17 mg/dL (ref 8–23)
CO2: 23 mmol/L (ref 22–32)
Calcium: 8.8 mg/dL — ABNORMAL LOW (ref 8.9–10.3)
Chloride: 109 mmol/L (ref 98–111)
Creatinine, Ser: 0.98 mg/dL (ref 0.61–1.24)
GFR calc Af Amer: >60 mL/min (ref >60)
GFR calc non Af Amer: >60 mL/min (ref >60)
Glucose, Bld: 125 mg/dL — ABNORMAL HIGH (ref 70–99)
Potassium: 3.8 mmol/L (ref 3.5–5.1)
Sodium: 142 mmol/L (ref 135–145)
Total Bilirubin: 1.4 mg/dL — ABNORMAL HIGH (ref 0.3–1.2)
Total Protein: 7.2 g/dL (ref 6.5–8.1)

## 2019-05-17 LAB — CBC WITH DIFFERENTIAL/PLATELET
Abs Immature Granulocytes: 0.03 10*3/uL (ref 0.00–0.07)
Basophils Absolute: 0.1 10*3/uL (ref 0.0–0.1)
Basophils Relative: 1 %
Eosinophils Absolute: 0.2 10*3/uL (ref 0.0–0.5)
Eosinophils Relative: 3 %
HCT: 41.7 % (ref 39.0–52.0)
Hemoglobin: 13.3 g/dL (ref 13.0–17.0)
Immature Granulocytes: 0 %
Lymphocytes Relative: 26 %
Lymphs Abs: 1.9 10*3/uL (ref 0.7–4.0)
MCH: 21.7 pg — ABNORMAL LOW (ref 26.0–34.0)
MCHC: 31.9 g/dL (ref 30.0–36.0)
MCV: 68.1 fL — ABNORMAL LOW (ref 80.0–100.0)
Monocytes Absolute: 0.9 10*3/uL (ref 0.1–1.0)
Monocytes Relative: 12 %
Neutro Abs: 4.2 10*3/uL (ref 1.7–7.7)
Neutrophils Relative %: 58 %
Platelets: 178 10*3/uL (ref 150–400)
RBC: 6.12 MIL/uL — ABNORMAL HIGH (ref 4.22–5.81)
RDW: 18.9 % — ABNORMAL HIGH (ref 11.5–15.5)
WBC: 7.3 10*3/uL (ref 4.0–10.5)
nRBC: 0 % (ref 0.0–0.2)

## 2019-05-17 LAB — POC SARS CORONAVIRUS 2 AG: SARS Coronavirus 2 Ag: NEGATIVE

## 2019-05-17 LAB — BRAIN NATRIURETIC PEPTIDE: B Natriuretic Peptide: 581 pg/mL — ABNORMAL HIGH (ref 0.0–100.0)

## 2019-05-17 LAB — TROPONIN I (HIGH SENSITIVITY): Troponin I (High Sensitivity): 121 ng/L (ref <18)

## 2019-05-17 MED ORDER — POTASSIUM CHLORIDE 20 MEQ PO PACK: 20.0000 meq | PACK | Freq: Two times a day (BID) | ORAL | 0 refills | Status: DC

## 2019-05-17 NOTE — ED Notes (Signed)
Pt verbalized understanding of discharge instructions. NAD at this time. 

## 2019-05-17 NOTE — ED Notes (Signed)
This RN spoke with lab about delay in lab results and was told the orders were placed after collection and they would run them now.

## 2019-05-17 NOTE — ED Provider Notes (Signed)
[facility] Emergency Department Provider Note  ____________________________________________  Time seen: Approximately 1:18 PM  I have reviewed the triage vital signs and the nursing notes.   HISTORY  Chief Complaint Shortness of Breath    HPI Bryce Brock is a 63 y.o. male with a history of CHF and hypertension who comes the ED today  complaining of worsening shortness of breath for the past 2 days.  Decreased exercise tolerance, has dyspnea on exertion.  Shortness of breath is most prominent when he is asleep at night and has to wake up catching his breath, consistent with paroxysmal nocturnal dyspnea.  Denies any chest pain.  No cough fever body aches chills sweats.  No known Covid exposure.     Past Medical History:  Diagnosis Date  . CHF (congestive heart failure) (HCC)   . Obesity   . Thyroid nodule      Patient Active Problem List   Diagnosis Date Noted  . Hypertensive urgency 04/29/2019  . Elevated troponin 04/29/2019  . History of thyroid nodule 04/29/2019  . Acute diastolic CHF (congestive heart failure) (HCC) 04/28/2019     Past Surgical History:  Procedure Laterality Date  . COLONOSCOPY WITH PROPOFOL N/A 12/22/2015   Procedure: COLONOSCOPY WITH PROPOFOL;  Surgeon: Scot Jun, MD;  Location: [facility] ENDOSCOPY;  Service: Endoscopy;  Laterality: N/A;  . SHOULDER ARTHROSCOPY    . THYROIDECTOMY, PARTIAL       Prior to Admission medications   Medication Sig Start Date End Date Taking? Authorizing Provider  acetaminophen (TYLENOL) 325 MG tablet Take 2 tablets (650 mg total) by mouth every 4 (four) hours as needed for headache or mild pain. 04/30/19   Rodolph Bong, MD  aspirin EC 81 MG EC tablet Take 1 tablet (81 mg total) by mouth daily. 05/01/19   Rodolph Bong, MD  atorvastatin (LIPITOR) 20 MG tablet Take 1 tablet (20 mg total) by mouth daily at 6 PM. 04/30/19   Rodolph Bong, MD  furosemide (LASIX) 20 MG tablet  Take 1 tablet (20 mg total) by mouth daily. 04/30/19 04/29/20  Rodolph Bong, MD  lisinopril (ZESTRIL) 5 MG tablet Take 1 tablet (5 mg total) by mouth daily. 05/01/19   Rodolph Bong, MD  metoprolol succinate (TOPROL-XL) 25 MG 24 hr tablet Take 1 tablet (25 mg total) by mouth daily. 05/01/19   Rodolph Bong, MD  potassium chloride (KLOR-CON) 20 MEQ packet Take 20 mEq by mouth 2 (two) times daily for 5 days. 05/17/19 05/22/19  Sharman Cheek, MD     Allergies Patient has no known allergies.   Family History  Problem Relation Age of Onset  . Breast cancer Mother   . Hypertension Father   . Coronary artery disease Father   . Diabetes Mellitus I Father   . Hypertension Brother     Social History Social History   Tobacco Use  . Smoking status: Never Smoker  . Smokeless tobacco: Never Used  Substance Use Topics  . Alcohol use: Not Currently  . Drug use: Never    Review of Systems  Constitutional:   No fever or chills.  ENT:   No sore throat. No rhinorrhea. Cardiovascular:   No chest pain or syncope. Respiratory:   Positive shortness of breath. Gastrointestinal:   Negative for abdominal pain, vomiting and diarrhea.  Musculoskeletal:   Negative for focal pain or swelling All other systems reviewed and are negative except as documented above in ROS and HPI.  ____________________________________________   PHYSICAL EXAM:  VITAL SIGNS: ED Triage Vitals  Enc Vitals Group     BP 05/17/19 0837 (!) 141/96     Pulse Rate 05/17/19 0837 78     Resp 05/17/19 0837 16     Temp 05/17/19 0837 98.1 F (36.7 C)     Temp Source 05/17/19 0837 Oral     SpO2 05/17/19 0837 96 %     Weight 05/17/19 0849 277 lb (125.6 kg)     Height 05/17/19 0849 5\' 8"  (1.727 m)     Head Circumference --      Peak Flow --      Pain Score 05/17/19 0849 0     Pain Loc --      Pain Edu? --      Excl. in [place]? --     Vital signs reviewed, nursing assessments reviewed.   Constitutional:    Alert and oriented. Non-toxic appearance. Eyes:   Conjunctivae are normal. EOMI. PERRL. ENT      Head:   Normocephalic and atraumatic.      Nose:   Wearing a mask.      Mouth/Throat:   Wearing a mask.      Neck:   No meningismus. Full ROM. Hematological/Lymphatic/Immunilogical:   No cervical lymphadenopathy. Cardiovascular:   RRR. Symmetric bilateral radial and DP pulses.  No murmurs. Cap refill less than 2 seconds. Respiratory:   Normal respiratory effort without tachypnea/retractions. Breath sounds are overall clear and equal bilaterally.  Mild bibasilar crackles Gastrointestinal:   Soft and nontender. Non distended. There is no CVA tenderness.  No rebound, rigidity, or guarding.  Musculoskeletal:   Normal range of motion in all extremities. No joint effusions.  No lower extremity tenderness.  1+ pitting edema bilateral lower extremities. Neurologic:   Normal speech and language.  Motor grossly intact. No acute focal neurologic deficits are appreciated.  Skin:    Skin is warm, dry and intact. No rash noted.  No petechiae, purpura, or bullae.  ____________________________________________    LABS (pertinent positives/negatives) (all labs ordered are listed, but only abnormal results are displayed) Labs Reviewed  COMPREHENSIVE METABOLIC PANEL - Abnormal; Notable for the following components:      Result Value   Glucose, Bld 125 (*)    Calcium 8.8 (*)    ALT 51 (*)    Total Bilirubin 1.4 (*)    All other components within normal limits  BRAIN NATRIURETIC PEPTIDE - Abnormal; Notable for the following components:   B Natriuretic Peptide 581.0 (*)    All other components within normal limits  CBC WITH DIFFERENTIAL/PLATELET - Abnormal; Notable for the following components:   RBC 6.12 (*)    MCV 68.1 (*)    MCH 21.7 (*)    RDW 18.9 (*)    All other components within normal limits  TROPONIN I (HIGH SENSITIVITY) - Abnormal; Notable for the following components:   Troponin I (High  Sensitivity) 121 (*)    All other components within normal limits  POC SARS CORONAVIRUS 2 AG -  ED  POC SARS CORONAVIRUS 2 AG  TROPONIN I (HIGH SENSITIVITY)   ____________________________________________   EKG  Interpreted by me Normal sinus rhythm rate of 70, normal axis and intervals.  Normal QRS ST segments and T waves.  No ischemic changes.  ____________________________________________    RADIOLOGY  Dg Chest 2 View  Result Date: 05/17/2019 CLINICAL DATA:  Shortness of breath, dizziness, cough. EXAM: CHEST - 2 VIEW COMPARISON:  Chest  x-ray dated 04/28/2019. FINDINGS: Mild cardiomegaly, stable. Stable central pulmonary vascular congestion no confluent opacity to suggest a developing pneumonia. No pleural effusion or pneumothorax is seen. No acute or suspicious osseous finding. IMPRESSION: Stable chest x-ray. Stable mild cardiomegaly and central pulmonary vascular congestion suggesting mild CHF/volume overload, likely chronic. No evidence of pneumonia or overt alveolar pulmonary edema. Electronically Signed   By: Bary RichardStan  Maynard M.D.   On: 05/17/2019 09:26    ____________________________________________   PROCEDURES Procedures  ____________________________________________  DIFFERENTIAL DIAGNOSIS   Non-STEMI, heart failure, pneumonia, Covid  CLINICAL IMPRESSION / ASSESSMENT AND PLAN / ED COURSE  Medications ordered in the ED: Medications - No data to display  Pertinent labs & imaging results that were available during my care of the patient were reviewed by me and considered in my medical decision making (see chart for details).  Bryce Brock was evaluated in Emergency Department on 05/17/2019 for the symptoms described in the history of present illness. He was evaluated in the context of the global COVID-19 pandemic, which necessitated consideration that the patient might be at risk for infection with the SARS-CoV-2 virus that causes COVID-19. Institutional protocols  and algorithms that pertain to the evaluation of patients at risk for COVID-19 are in a state of rapid change based on information released by regulatory bodies including the CDC and federal and state organizations. These policies and algorithms were followed during the patient's care in the ED.   Patient presents with dyspnea on exertion and PND with recent hospitalization for heart failure.  Echo from last hospitalization shows an EF of 55% and grade 1 diastolic dysfunction.  Symptoms are intermittent, worse laying down.  No chest pain.  Doubt ACS PE dissection AAA pneumothorax carditis.  Patient's not septic.  Vital signs are reassuring.  Chest x-ray is consistent with some pulmonary vascular congestion.  He has some peripheral edema.  Troponin is at his recent baseline and not consistent with ACS.  BNP is more elevated than prior consistent with worsening heart failure..  No evidence of bacterial pneumonia, Covid screening test is negative.  I will have the patient increase his furosemide dose and take a potassium supplement and follow-up with the heart failure clinic tomorrow.        ____________________________________________   FINAL CLINICAL IMPRESSION(S) / ED DIAGNOSES    Final diagnoses:  Acute on chronic congestive heart failure, unspecified heart failure type (HCC)  Dyspnea on exertion   ED Discharge Orders         Ordered    potassium chloride (KLOR-CON) 20 MEQ packet  2 times daily     05/17/19 1317          Portions of this note were generated with dragon dictation software. Dictation errors may occur despite best attempts at proofreading.   Sharman CheekStafford, Analiza Cowger, MD 05/17/19 1323

## 2019-05-17 NOTE — ED Notes (Signed)
Report given to Kim, RN.

## 2019-05-17 NOTE — Discharge Instructions (Signed)
Please increase your furosemide to 40mg  (2 tablets) once a day for the next 3 days.  You should also take a potassium supplement during this time to prevent low potassium. Please follow up with the [facility] heart failure clinic in the next 1-2 days as well to continue monitoring your symptoms.  Your lab tests today are consistent with heart failure, and your COVID screening test was negative.

## 2019-05-17 NOTE — ED Triage Notes (Signed)
Patient c/o SOB since Friday. HX CHF

## 2019-06-16 NOTE — Progress Notes (Deleted)
   Patient ID: Bryce Brock, male    DOB: [birth year], 63 y.o.   MRN: [id number]  HPI  [name] is a 63 y/o male with a history of  Echo report from 04/29/2019 reviewed and showed an EF of 50-55% along with mild/moderate MR and mild TR.  Was in the ED 05/17/2019 due to HF exacerbation. CXR shows pulmonary congestion. Diuretic dose increased and he was released. Admitted 04/28/2019 due to new onset acute heart failure. Cardiology consult obtained. Initially given IV lasix and then transitioned to oral diuretics. Elevated cardiac enzymes thought to be due to demand ischemia. Discharged after 2 days.    Review of Systems    Physical Exam  Assessment & Plan:  1: Chronic heart failure with preserved ejection fraction- - NYHA class - saw cardiology Clayborn Bigness) 05/28/2019 - BNP 05/17/2019 was 581.0  2: HTN- - BP - BMP 05/17/2019 reviewed and showed sodium 142, potassium 3.8, creatinine 0.98 and GFR >60

## 2019-06-17 ENCOUNTER — Ambulatory Visit: Payer: Commercial Managed Care - PPO | Admitting: Family

## 2019-06-17 ENCOUNTER — Telehealth: Payer: Self-pay | Admitting: Family

## 2019-06-17 NOTE — Telephone Encounter (Signed)
Patient did not show for his Heart Failure Clinic appointment on 06/17/2019. Will attempt to reschedule.  

## 2019-09-21 ENCOUNTER — Ambulatory Visit: Payer: Self-pay | Attending: Internal Medicine

## 2019-09-21 DIAGNOSIS — Z23 Encounter for immunization: Secondary | ICD-10-CM

## 2019-09-21 NOTE — Progress Notes (Signed)
   Covid-19 Vaccination Clinic  Name:  Bryce Brock    MRN: [id number] DOB: 02/14/1956  09/21/2019  [name] was observed post Covid-19 immunization for 15 minutes without incident. He was provided with Vaccine Information Sheet and instruction to access the V-Safe system.   [name] was instructed to call 911 with any severe reactions post vaccine: Marland Kitchen Difficulty breathing  . Swelling of face and throat  . A fast heartbeat  . A bad rash all over body  . Dizziness and weakness   Immunizations Administered    Name Date Dose VIS Date Route   Pfizer COVID-19 Vaccine 09/21/2019  4:37 PM 0.3 mL 05/29/2019 Intramuscular   Manufacturer: [employer], [employer]   Lot: [id number]   NDC: [address]-6

## 2019-10-12 ENCOUNTER — Ambulatory Visit: Payer: Self-pay | Attending: Internal Medicine

## 2019-10-12 DIAGNOSIS — Z23 Encounter for immunization: Secondary | ICD-10-CM

## 2019-10-12 NOTE — Progress Notes (Signed)
   Covid-19 Vaccination Clinic  Name:  Bryce Brock    MRN: [id number] DOB: 1955-12-20  10/12/2019  Bryce Brock was observed post Covid-19 immunization for 15 minutes without incident. He was provided with Vaccine Information Sheet and instruction to access the V-Safe system.   Bryce Brock was instructed to call 911 with any severe reactions post vaccine: Marland Kitchen Difficulty breathing  . Swelling of face and throat  . A fast heartbeat  . A bad rash all over body  . Dizziness and weakness   Immunizations Administered    Name Date Dose VIS Date Route   Pfizer COVID-19 Vaccine 10/12/2019  4:33 PM 0.3 mL 08/12/2018 Intramuscular   Manufacturer: [employer], [employer]   Lot: [id number]   NDC: [address]-9

## 2020-10-26 IMAGING — CR DG CHEST 2V
1 series · 2 of 2 positions shown · non-contrast
Comparison: Chest x-ray dated 04/28/2019.

CLINICAL DATA: Shortness of breath, dizziness, cough.

EXAM:
CHEST - 2 VIEW

[Series 1: dg chest 2 view · 0.14mm/px · 2 of 2 slices shown]
[im 1/2]
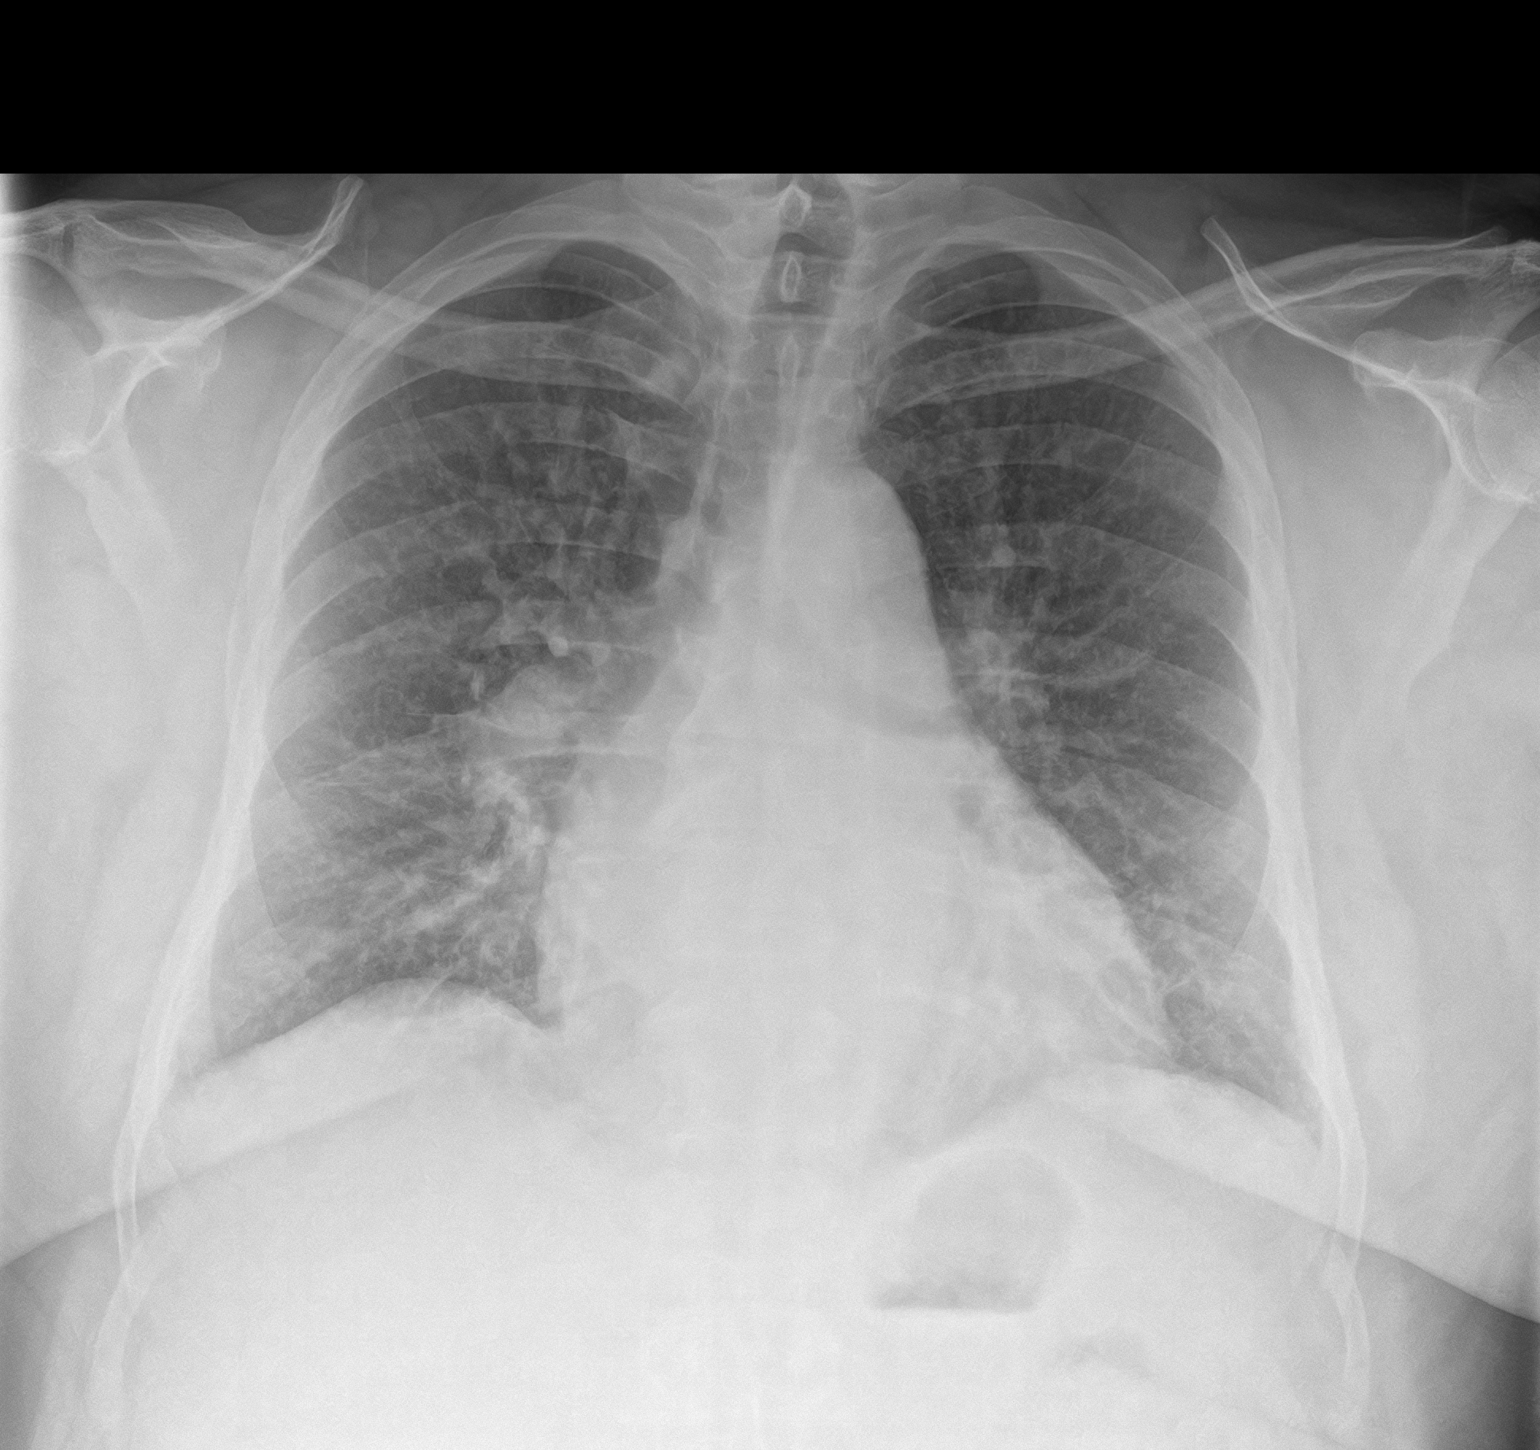
[im 2/2]
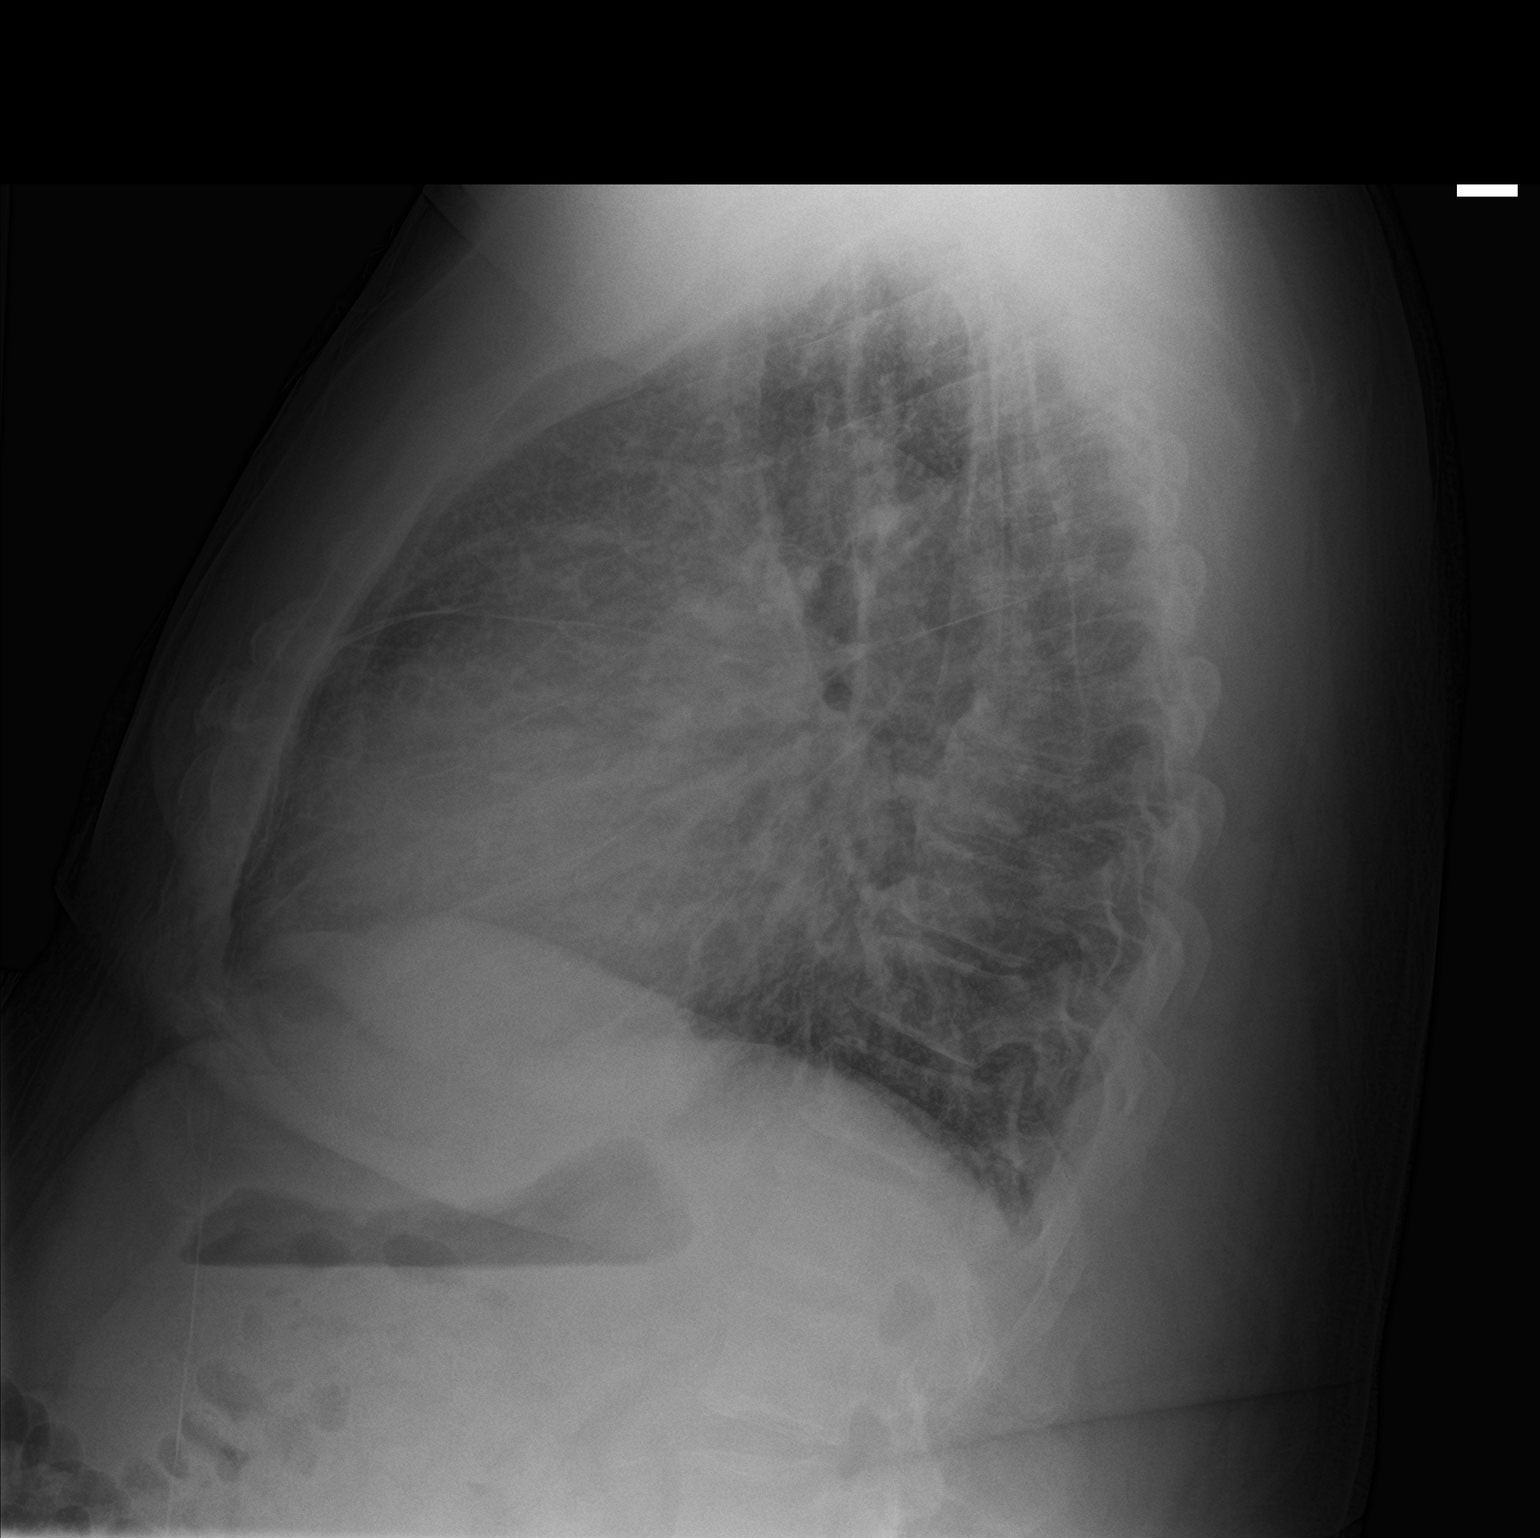

[2 of 2 positions shown; findings below may reference images not displayed]

FINDINGS: Mild cardiomegaly, stable. Stable central pulmonary vascular
congestion no confluent opacity to suggest a developing pneumonia.
No pleural effusion or pneumothorax is seen. No acute or suspicious
osseous finding.
IMPRESSION: Stable chest x-ray. Stable mild cardiomegaly and central pulmonary
vascular congestion suggesting mild CHF/volume overload, likely
chronic. No evidence of pneumonia or overt alveolar pulmonary edema.

## 2021-10-09 ENCOUNTER — Other Ambulatory Visit
Admission: RE | Admit: 2021-10-09 | Discharge: 2021-10-09 | Disposition: A | Discharge status: Home / Self Care | Payer: Commercial Managed Care - PPO | Source: Ambulatory Visit | Attending: Student | Admitting: Student

## 2021-10-09 DIAGNOSIS — Z01818 Encounter for other preprocedural examination: Secondary | ICD-10-CM | POA: Diagnosis present

## 2021-10-09 LAB — BRAIN NATRIURETIC PEPTIDE: B Natriuretic Peptide: 210.6 pg/mL — ABNORMAL HIGH (ref 0.0–100.0)

## 2021-10-16 ENCOUNTER — Encounter
Admission: RE | Disposition: A | Discharge status: Home / Self Care | Payer: Self-pay | Source: Home / Self Care | Attending: Internal Medicine

## 2021-10-16 ENCOUNTER — Ambulatory Visit
Admission: RE | Admit: 2021-10-16 | Discharge: 2021-10-16 | Disposition: A | Discharge status: Home / Self Care | Payer: Commercial Managed Care - PPO | Attending: Internal Medicine | Admitting: Internal Medicine

## 2021-10-16 ENCOUNTER — Encounter: Payer: Self-pay | Admitting: Internal Medicine

## 2021-10-16 ENCOUNTER — Other Ambulatory Visit: Payer: Self-pay

## 2021-10-16 DIAGNOSIS — R0602 Shortness of breath: Secondary | ICD-10-CM | POA: Insufficient documentation

## 2021-10-16 DIAGNOSIS — G4733 Obstructive sleep apnea (adult) (pediatric): Secondary | ICD-10-CM | POA: Insufficient documentation

## 2021-10-16 DIAGNOSIS — I200 Unstable angina: Secondary | ICD-10-CM | POA: Insufficient documentation

## 2021-10-16 DIAGNOSIS — I272 Pulmonary hypertension, unspecified: Secondary | ICD-10-CM | POA: Diagnosis not present

## 2021-10-16 DIAGNOSIS — I110 Hypertensive heart disease with heart failure: Secondary | ICD-10-CM | POA: Insufficient documentation

## 2021-10-16 DIAGNOSIS — R9430 Abnormal result of cardiovascular function study, unspecified: Secondary | ICD-10-CM | POA: Diagnosis present

## 2021-10-16 DIAGNOSIS — I420 Dilated cardiomyopathy: Secondary | ICD-10-CM | POA: Diagnosis not present

## 2021-10-16 DIAGNOSIS — I340 Nonrheumatic mitral (valve) insufficiency: Secondary | ICD-10-CM | POA: Insufficient documentation

## 2021-10-16 DIAGNOSIS — I509 Heart failure, unspecified: Secondary | ICD-10-CM | POA: Insufficient documentation

## 2021-10-16 HISTORY — PX: RIGHT/LEFT HEART CATH AND CORONARY ANGIOGRAPHY: CATH118266

## 2021-10-16 SURGERY — RIGHT/LEFT HEART CATH AND CORONARY ANGIOGRAPHY
Anesthesia: Moderate Sedation

## 2021-10-16 MED ORDER — SODIUM CHLORIDE 0.9 % IV SOLN: 250.0000 mL | INTRAVENOUS | Status: DC | PRN

## 2021-10-16 MED ORDER — SODIUM CHLORIDE 0.9% FLUSH: 3.0000 mL | Freq: Two times a day (BID) | INTRAVENOUS | Status: DC

## 2021-10-16 MED ORDER — SODIUM CHLORIDE 0.9 % WEIGHT BASED INFUSION: 1.0000 mL/kg/h | INTRAVENOUS | Status: DC

## 2021-10-16 MED ORDER — FENTANYL CITRATE (PF) 100 MCG/2ML IJ SOLN
INTRAMUSCULAR | Status: AC
  Filled 2021-10-16: qty 2

## 2021-10-16 MED ORDER — SODIUM CHLORIDE 0.9 % WEIGHT BASED INFUSION
3.0000 mL/kg/h | INTRAVENOUS | Status: AC
  Administered 2021-10-16: 3 mL/kg/h via INTRAVENOUS

## 2021-10-16 MED ORDER — HEPARIN (PORCINE) IN NACL 1000-0.9 UT/500ML-% IV SOLN
INTRAVENOUS | Status: DC | PRN
  Administered 2021-10-16 (×2): 500 mL

## 2021-10-16 MED ORDER — ASPIRIN 81 MG PO CHEW
CHEWABLE_TABLET | ORAL | Status: AC
  Filled 2021-10-16: qty 1

## 2021-10-16 MED ORDER — IOHEXOL 300 MG/ML  SOLN
INTRAMUSCULAR | Status: DC | PRN
Start: 2021-10-16 — End: 2021-10-16
  Administered 2021-10-16: 105 mL

## 2021-10-16 MED ORDER — HEPARIN SODIUM (PORCINE) 1000 UNIT/ML IJ SOLN
INTRAMUSCULAR | Status: AC
  Filled 2021-10-16: qty 10

## 2021-10-16 MED ORDER — VERAPAMIL HCL 2.5 MG/ML IV SOLN
INTRAVENOUS | Status: AC
  Filled 2021-10-16: qty 2

## 2021-10-16 MED ORDER — HEPARIN (PORCINE) IN NACL 1000-0.9 UT/500ML-% IV SOLN
INTRAVENOUS | Status: AC
  Filled 2021-10-16: qty 1000

## 2021-10-16 MED ORDER — ONDANSETRON HCL 4 MG/2ML IJ SOLN: 4.0000 mg | Freq: Four times a day (QID) | INTRAMUSCULAR | Status: DC | PRN

## 2021-10-16 MED ORDER — HEPARIN SODIUM (PORCINE) 1000 UNIT/ML IJ SOLN
INTRAMUSCULAR | Status: DC | PRN
  Administered 2021-10-16: 5000 [IU] via INTRAVENOUS

## 2021-10-16 MED ORDER — SODIUM CHLORIDE 0.9% FLUSH: 3.0000 mL | INTRAVENOUS | Status: DC | PRN

## 2021-10-16 MED ORDER — MIDAZOLAM HCL 2 MG/2ML IJ SOLN
INTRAMUSCULAR | Status: DC | PRN
  Administered 2021-10-16: 1 mg via INTRAVENOUS

## 2021-10-16 MED ORDER — LIDOCAINE HCL 1 % IJ SOLN
INTRAMUSCULAR | Status: AC
  Filled 2021-10-16: qty 20

## 2021-10-16 MED ORDER — MIDAZOLAM HCL 2 MG/2ML IJ SOLN
INTRAMUSCULAR | Status: AC
  Filled 2021-10-16: qty 2

## 2021-10-16 MED ORDER — LIDOCAINE HCL (PF) 1 % IJ SOLN
INTRAMUSCULAR | Status: DC | PRN
  Administered 2021-10-16: 2 mL

## 2021-10-16 MED ORDER — ASPIRIN 81 MG PO CHEW
81.0000 mg | CHEWABLE_TABLET | ORAL | Status: AC
  Administered 2021-10-16: 81 mg via ORAL

## 2021-10-16 MED ORDER — ACETAMINOPHEN 325 MG PO TABS: 650.0000 mg | ORAL_TABLET | ORAL | Status: DC | PRN

## 2021-10-16 MED ORDER — HYDRALAZINE HCL 20 MG/ML IJ SOLN: 10.0000 mg | INTRAMUSCULAR | Status: DC | PRN

## 2021-10-16 MED ORDER — LABETALOL HCL 5 MG/ML IV SOLN: 10.0000 mg | INTRAVENOUS | Status: DC | PRN

## 2021-10-16 MED ORDER — FENTANYL CITRATE (PF) 100 MCG/2ML IJ SOLN
INTRAMUSCULAR | Status: DC | PRN
Start: 2021-10-16 — End: 2021-10-16
  Administered 2021-10-16: 25 ug via INTRAVENOUS

## 2021-10-16 MED ORDER — VERAPAMIL HCL 2.5 MG/ML IV SOLN
INTRAVENOUS | Status: DC | PRN
  Administered 2021-10-16: 2.5 mg via INTRA_ARTERIAL

## 2021-10-16 SURGICAL SUPPLY — 14 items
CATH 5FR JL3.5 JR4 ANG PIG MP (CATHETERS) ×1 IMPLANT
CATH BALLN WEDGE 5F 110CM (CATHETERS) ×1 IMPLANT
CATH INFINITI 5FR JL4 (CATHETERS) IMPLANT
DEVICE RAD TR BAND REGULAR (VASCULAR PRODUCTS) ×1 IMPLANT
DRAPE BRACHIAL (DRAPES) ×2 IMPLANT
GLIDESHEATH SLEND SS 6F .021 (SHEATH) ×1 IMPLANT
GUIDEWIRE EMER 3M J .025X150CM (WIRE) ×1 IMPLANT
GUIDEWIRE INQWIRE 1.5J.035X260 (WIRE) IMPLANT
INQWIRE 1.5J .035X260CM (WIRE) ×2
PACK CARDIAC CATH (CUSTOM PROCEDURE TRAY) ×2 IMPLANT
PROTECTION STATION PRESSURIZED (MISCELLANEOUS) ×2
SET ATX SIMPLICITY (MISCELLANEOUS) ×1 IMPLANT
SHEATH GLIDE SLENDER 4/5FR (SHEATH) ×1 IMPLANT
STATION PROTECTION PRESSURIZED (MISCELLANEOUS) IMPLANT

## 2021-10-17 ENCOUNTER — Encounter: Payer: Self-pay | Admitting: Internal Medicine

## 2024-01-05 NOTE — Progress Notes (Signed)
 [facility] IV Diuresis Clinic Note  Primary Cardiologist/Cardiology Provider(s):  Dr Gil PCP:  Virgene Velva Asters, MD  Last Cardiology Clinic Visit:  12/27/2023 Last IV Diuresis Visit: 12/27/23  Reason for Visit: Bryce Brock is a 68 y.o. male with a history of combined systolic and diastolic heart failure, who is being seen today in the [facility] IV [facility]ures[facility] for an urgent visit for volume status optimization including IV diuresis.  Assessment/Plan: Acute on Chronic combined systolic and diastolic heart failure, EF 40% - Volume status today is increased. Weight 295.1 --> 295.8 --> 298 --> 294.8 --> 296.7 --> 297, target weight 285lbs. Dietary indiscretion but improving per patient's report.  - NYHA Class II-III - IV Lasix  160 mg was administered during clinic with robust response - Labs were obtained: Cr 0.87, K 3.9, pro-BNP 383 - Increase torsemide to 60mg  BID.  - Otherwise on Jardiance 10mg  daily, losartan 50mg  daily, spironolactone 25mg  daily - Was on metoprolol  XL 12.5mg  daily, but held after last RHC - follow up here in 2 weeks; then Dr Gil on 10/30  Response to IV diuresis:       Medications Heart rate Blood pressure Weight (lbs)  Pre  64 107/60 297.1  Hour 0 IV Lasix  160 mg     Hour 2 IV Lasix  0 mg 67 106/50   Post    293.3   Output: I/O       07/19 0701 07/20 0700 07/20 0701 07/21 0700 07/21 0701 07/22 0700   Urine (mL/kg/hr)   1550   Total Output(mL/kg)   1550 (11.5)   Net   -1550         Urine Occurrence   4 x       Return to clinic:   Return in about 2 weeks (around 01/20/2024).  Future Appointments  Date Time Provider Department Center  01/21/2024 10:00 AM DIURESIS HF NP [facility]  04/16/2024 10:20 AM Gil Franky Barter, MD [facility]      History of Present Illness: Bryce Brock is a 68 y.o. male with a history of combined systolic and diastolic heart failure who presents today for IV diuresis. Patient  underwent RHC with Dr  Gil on 11/06/23, which showed very elevated right- and left- sided filling pressures; severe group 2 pulmonary hypertension (mPAP 43 mmHg, PW , PVR 1.8 wu); very low cardiac output and index (Fick CO 4 4.4 L/min; CI 1.8 L/min/m^2). His metoprolol  was held due to low cardiac output, and his Lasix  was increased to 80mg  BID. He was also started on Jardiance.   11/20/23 Today presents for follow up of volume status. He only took the Lasix  80 BID for about 4 days, and then reduce to 80 daily. His wife thought he was only supposed to take the higher dose for a few days. His weight did decrease on the higher dose, as low as 289lbs, but then went back up after he decreased it. Now he's up to 294lbs on his home scale yesterday. He felt pretty good when he was 289; could do more and was less short of breath. He thinks his target weight is in the 280 range. he is feeling pretty SOB w/ exertion. No PND or orthopnea. He uses CPAP every night. He is not swollen in his legs.  He doesn't feel bloated. He doesn't eat as much as he used to.  Dizzy when he stands quickly.   11/25/23 presents for follow up. He has been taking  Lasix  80mg  BID. His weight decreased down to 292.6 at the lowest, but today was back up to 295. He definitely felt better after getting some fluid off last visit. He had a family reunion and probably didn't eat well since last week. He is SOB and feels bloated. He is not swollen. No orthopnea/PND.   12/06/23 Bryce Brock, accompanied by his wife, presents for follow up. Reports feeling a little better in terms of activity tolerance compared to previous visit. He is able to push his wife on wheelchair to the clinic, which he was not able to last time. 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 Instruct that he can take 1 dose of 80mg  on those days. He has been  drinking 1 gallon fluid a day, we discuss to limit his fluid intake to less than 2L (0.5 gallon), verbalized understanding and will do so. Sleeps fine with nightly CPAP wearing, no orthopnea or PND. No chest pain or palpitations. No dizziness.    12/13/23 Weight at home is around 294. No swelling. Breathing OK, a little bit better.  Not as bloated. Can wear pants he hasn't worn in awhile. Had to use valet last time, today felt like walking in. Taking torsemide 40mg  BID.  Headed back in the right direction. He feels good, his breathing is about the same, maybe a little better. Torsemide is much better, he can tell a difference  7/11  He presents for follow up. He admits that he did not eat well over the 4th of July weekend - ate salty and consequently gained weight. He actually stopped weighing himself for a few days because he was discouraged and was surprised it wasn't higher today. He does feel like he is urinating a lot with the torsemide 40mg  twice a day. He is breathing pretty well - knee is bothering him, but otherwise could have parked and walked in today.  He doesn't sleep well, but not due to breathing problems. Uses his cpap at night. No swelling. Not bloated.   Today, he presents for follow up, accompanied by his wife. Reports feel more winded for the past three days. He called the clinic to reschedule his IV diuresis appointment sooner from this Friday to today. Reports feeling much better/closer to his baseline this morning. He stopped weighing himself because he doesn't want to see the readings when his weight close to 300lbs. No LE edema, but endorses abdominal bloating/distention. He doesn't check his BP at home, denies dizziness or syncope. He took torsemide 60mg  two times a day for three days per instruction at last diuresis clinic visit, reports he did feel less winded/SOB those three days. Sleeps fine using CPAP nightly, no orthopnea or PND. We have a detailed discussion on heart healthy  diet and portion, verbalized understanding and will keep improving. States he is going to have AF cardioversion the end of this month locally.   Past Medical History:  Diagnosis Date  . CHF (congestive heart failure)       Past Surgical History:  Procedure Laterality Date  . CARDIAC CATHETERIZATION    . PR CATH PLACE/CORON ANGIO, IMG SUPER/INTERP,W LEFT HEART VENTRICULOGRAPHY N/A 02/22/2017   Procedure: Left Heart Catheterization;  Surgeon: Ernestine Lilton Queen, MD;  Location: [facility] CATH;  Service: Cardiology  . PR RIGHT HEART CATH O2 SATURATION & CARDIAC  OUTPUT N/A 11/06/2023   Procedure: Right Heart Catheterization;  Surgeon: Gil Franky Barter, MD;  Location: [facility] CATH;  Service: Cardiology    Current Outpatient Medications  Medication Sig Dispense Refill  . albuterol HFA 90 mcg/actuation inhaler Inhale 2 puffs two (2) times a day.    . atorvastatin  (LIPITOR) 20 MG tablet Take 1 tablet (20 mg total) by mouth daily.    SABRA azelastine (ASTELIN) 137 mcg (0.1 %) nasal spray USE 2 SPRAY(S) IN EACH NOSTRIL TWICE DAILY AS NEEDED    . losartan (COZAAR) 50 MG tablet Take 1 tablet (50 mg total) by mouth daily.    SABRA spironolactone (ALDACTONE) 25 MG tablet Take 1 tablet (25 mg total) by mouth daily.    SABRA warfarin (JANTOVEN) 3 MG tablet Take 1 tablet (3 mg total) by mouth daily.    SABRA warfarin (JANTOVEN) 6 MG tablet Take 1 tablet (6 mg total) by mouth daily.    . empagliflozin (JARDIANCE) 10 mg tablet Take 1 tablet (10 mg total) by mouth daily. (Patient not taking: Reported on 01/06/2024) 30 tablet 3  . torsemide (DEMADEX) 20 MG tablet Take 3 tablets (60 mg total) by mouth two (2) times a day. 180 tablet 11   No current facility-administered medications for this visit.   Allergies  Allergen Reactions  . Lisinopril  Cough, Other (See Comments) and Rash    Other reaction(s): Cough  lisinopril   . Losartan Other (See Comments) and Rash    unknown  losartan    Objective:    Physical Exam BP  107/60 (BP Site: R Arm, BP Position: Sitting, BP Cuff Size: Large)   Pulse 64   Ht 172.7 cm (5' 8)   Wt (!) 134.8 kg (297 lb 1.6 oz)   SpO2 97%   BMI 45.17 kg/m   Wt Readings from Last 12 Encounters:  01/06/24 (!) 134.8 kg (297 lb 1.6 oz)  12/27/23 (!) 132.9 kg (293 lb 1.6 oz)  12/13/23 (!) 132.4 kg (291 lb 12.8 oz)  12/06/23 (!) 133.4 kg (294 lb)  11/25/23 (!) 133.2 kg (293 lb 11.2 oz)  11/20/23 (!) 133.1 kg (293 lb 6.4 oz)  11/06/23 (!) 131.5 kg (290 lb)  10/31/23 (!) 136.3 kg (300 lb 8 oz)  10/16/23 (!) 133.8 kg (295 lb)  03/26/17 (!) 120.7 kg (266 lb)  02/22/17 (!) 121.6 kg (268 lb)  01/08/17 (!) 122.7 kg (270 lb 6.4 oz)    General:  Patient is well-appearing in no acute distress  Eyes:  Intact, sclerae anicteric.  Ears, nose, mouth: Benign  Respiratory:   Normal respiratory effort. Clear to auscultation bilaterally.  There are no wheezes.  Cardiovascular:  JVP not seen above the clavicle with HOB at 90 degrees although difficult to assess due to body habitus.  Rate and rhythm are irregularly irregular.  There is no lifts or heaves.  Normal S1, S2. There is no murmur, gallops or rubs.  Radial and pedal pulses are 2+, bilaterally.   There is 1+ pitting pedal/pretibial edema, bilaterally.   Gastrointestinal:   Rounded/firm, non-tender, with audible bowel sounds. Abdomen distended.  Liver is nonpalpable.  Musculoskeletal: No joint swelling  Skin: Warm, well perfused.  Neurologic: Appropriate mood and affect. Alert and oriented to person, place, and time. No gross motor or sensory deficits evident.   Recent Labs: Office Visit on 01/06/2024  Component Date Value Ref Range Status  . Sodium 01/06/2024 138  135 - 145 mmol/L Final  . Potassium 01/06/2024 3.9  3.4 -  4.8 mmol/L Final  . Chloride 01/06/2024 102  98 - 107 mmol/L Final  . CO2 01/06/2024 25.8  20.0 - 31.0 mmol/L Final  . Anion Gap 01/06/2024 10  5 - 14 mmol/L Final  . BUN 01/06/2024 22  9 - 23 mg/dL Final  . Creatinine  92/78/7974 0.87  0.73 - 1.18 mg/dL Final  . BUN/Creatinine Ratio 01/06/2024 25   Final  . eGFR CKD-EPI (2021) Male 01/06/2024 >90  >=60 mL/min/1.31m2 Final  . Glucose 01/06/2024 183 (H)  70 - 179 mg/dL Final  . Calcium  01/06/2024 8.8  8.7 - 10.4 mg/dL Final  . PRO-BNP 92/78/7974 383.0 (H)  <=300.0 pg/mL Final    Lab Results  Component Value Date   PRO-BNP 383.0 (H) 01/06/2024   PRO-BNP 551.0 (H) 12/27/2023   PRO-BNP 549.0 (H) 12/13/2023   PRO-BNP 711.0 (H) 12/06/2023   PRO-BNP 475.0 (H) 11/25/2023   Creatinine 0.87 01/06/2024   Creatinine 0.89 12/27/2023   Creatinine 0.91 12/13/2023   Creatinine 0.92 12/06/2023   Creatinine 1.15 11/25/2023   BUN 22 01/06/2024   BUN 20 12/27/2023   BUN 24 (H) 12/13/2023   BUN 24 (H) 12/06/2023   BUN 23 11/25/2023   Sodium 138 01/06/2024   Sodium 139 12/27/2023   Sodium 138 12/13/2023   Sodium 138 12/06/2023   Sodium 140 11/25/2023   Potassium 3.9 01/06/2024   Potassium 3.7 12/27/2023   Potassium 3.6 12/13/2023   Potassium 3.6 12/06/2023   Potassium 3.9 11/25/2023   Magnesium 2.2 12/27/2023   Magnesium 2.1 12/13/2023   Magnesium 2.4 11/25/2023   Magnesium 2.5 11/20/2023    Metric Tracker: Did today's visit result in ED visit? No Did today's visit result in hospital admission? No Did today's visit result in referral to cardiology? n/a Today's visit was a referral from [facility] Cardiology

## 2024-01-14 MED ORDER — SODIUM CHLORIDE 0.9 % IV SOLN: INTRAVENOUS | Status: DC

## 2024-01-15 ENCOUNTER — Encounter
Admission: RE | Disposition: A | Discharge status: Home / Self Care | Payer: Self-pay | Source: Home / Self Care | Attending: Internal Medicine

## 2024-01-15 ENCOUNTER — Ambulatory Visit
Admission: RE | Admit: 2024-01-15 | Discharge: 2024-01-15 | Disposition: A | Discharge status: Home / Self Care | Attending: Internal Medicine | Admitting: Internal Medicine

## 2024-01-15 ENCOUNTER — Ambulatory Visit: Admitting: Anesthesiology

## 2024-01-15 ENCOUNTER — Other Ambulatory Visit: Payer: Self-pay

## 2024-01-15 ENCOUNTER — Encounter: Payer: Self-pay | Admitting: Internal Medicine

## 2024-01-15 DIAGNOSIS — I493 Ventricular premature depolarization: Secondary | ICD-10-CM | POA: Diagnosis not present

## 2024-01-15 DIAGNOSIS — I4819 Other persistent atrial fibrillation: Secondary | ICD-10-CM

## 2024-01-15 DIAGNOSIS — I509 Heart failure, unspecified: Secondary | ICD-10-CM | POA: Insufficient documentation

## 2024-01-15 DIAGNOSIS — I4891 Unspecified atrial fibrillation: Secondary | ICD-10-CM | POA: Insufficient documentation

## 2024-01-15 DIAGNOSIS — I110 Hypertensive heart disease with heart failure: Secondary | ICD-10-CM | POA: Insufficient documentation

## 2024-01-15 HISTORY — PX: CARDIOVERSION: SHX1299

## 2024-01-15 SURGERY — CARDIOVERSION
Anesthesia: General

## 2024-01-15 MED ORDER — ACETAMINOPHEN 500 MG PO TABS
500.0000 mg | ORAL_TABLET | Freq: Once | ORAL | Status: AC
  Administered 2024-01-15: 500 mg via ORAL

## 2024-01-15 MED ORDER — LIDOCAINE HCL (PF) 2 % IJ SOLN
INTRAMUSCULAR | Status: AC
  Filled 2024-01-15: qty 5

## 2024-01-15 MED ORDER — ACETAMINOPHEN 500 MG PO TABS
ORAL_TABLET | ORAL | Status: AC
  Filled 2024-01-15: qty 1

## 2024-01-15 MED ORDER — PROPOFOL 1000 MG/100ML IV EMUL
INTRAVENOUS | Status: AC
  Filled 2024-01-15: qty 100

## 2024-01-15 MED ORDER — LIDOCAINE HCL (PF) 2 % IJ SOLN
INTRAMUSCULAR | Status: DC | PRN
  Administered 2024-01-15: 50 mg via INTRADERMAL

## 2024-01-15 MED ORDER — PROPOFOL 10 MG/ML IV BOLUS
INTRAVENOUS | Status: DC | PRN
Start: 2024-01-15 — End: 2024-01-15
  Administered 2024-01-15: 80 mg via INTRAVENOUS

## 2024-01-15 NOTE — Transfer of Care (Signed)
 Immediate Anesthesia Transfer of Care Note  Patient: Bryce Brock  Procedure(s) Performed: CARDIOVERSION  Patient Location: PACU and Nursing Unit  Anesthesia Type:General  Level of Consciousness: drowsy and patient cooperative  Airway & Oxygen Therapy: Patient Spontanous Breathing and Patient connected to nasal cannula oxygen  Post-op Assessment: Report given to RN and Post -op Vital signs reviewed and stable  Post vital signs: Reviewed and stable  Last Vitals:  Vitals Value Taken Time  BP 118/93 01/15/24 08:01  Temp    Pulse 65 01/15/24 08:01  Resp 21 01/15/24 08:01  SpO2 99 % 01/15/24 08:01  Vitals shown include unfiled device data.  Last Pain:  Vitals:   01/15/24 0652  TempSrc: Temporal  PainSc: 0-No pain         Complications: No notable events documented.

## 2024-01-15 NOTE — Anesthesia Preprocedure Evaluation (Signed)
 Anesthesia Evaluation  Patient identified by MRN, date of birth, ID band Patient awake    Reviewed: Allergy & Precautions, NPO status , Patient's Chart, lab work & pertinent test results  History of Anesthesia Complications Negative for: history of anesthetic complications  Airway Mallampati: III  TM Distance: >3 FB Neck ROM: full    Dental  (+) Chipped, Poor Dentition, Missing, Partial Lower   Pulmonary sleep apnea    Pulmonary exam normal        Cardiovascular Exercise Tolerance: Good hypertension, (-) angina +CHF  + dysrhythmias Atrial Fibrillation  Rhythm:irregular Rate:Normal     Neuro/Psych negative neurological ROS  negative psych ROS   GI/Hepatic negative GI ROS, Neg liver ROS,,,  Endo/Other  negative endocrine ROS    Renal/GU negative Renal ROS  negative genitourinary   Musculoskeletal   Abdominal   Peds  Hematology negative hematology ROS (+)   Anesthesia Other Findings Past Medical History: No date: CHF (congestive heart failure) (HCC) No date: Obesity No date: Thyroid nodule  Past Surgical History: 12/22/2015: COLONOSCOPY WITH PROPOFOL ; N/A     Comment:  Procedure: COLONOSCOPY WITH PROPOFOL ;  Surgeon: Lamar ONEIDA Holmes, MD;  Location: [facility] ENDOSCOPY;  Service:               Endoscopy;  Laterality: N/A; 10/16/2021: RIGHT/LEFT HEART CATH AND CORONARY ANGIOGRAPHY; N/A     Comment:  Procedure: RIGHT/LEFT HEART CATH AND CORONARY               ANGIOGRAPHY;  Surgeon: Florencio Cara BIRCH, MD;  Location:              [facility] INVASIVE CV LAB;  Service: Cardiovascular;                Laterality: N/A; No date: SHOULDER ARTHROSCOPY No date: THYROIDECTOMY, PARTIAL  BMI    Body Mass Index: 45.16 kg/m      Reproductive/Obstetrics negative OB ROS                              Anesthesia Physical Anesthesia Plan  ASA: 3  Anesthesia Plan: General   Post-op Pain  Management:    Induction: Intravenous  PONV Risk Score and Plan: Propofol  infusion and TIVA  Airway Management Planned: Natural Airway and Nasal Cannula  Additional Equipment:   Intra-op Plan:   Post-operative Plan:   Informed Consent: I have reviewed the patients History and Physical, chart, labs and discussed the procedure including the risks, benefits and alternatives for the proposed anesthesia with the patient or authorized representative who has indicated his/her understanding and acceptance.     Dental Advisory Given  Plan Discussed with: Anesthesiologist, CRNA and Surgeon  Anesthesia Plan Comments: (Patient consented for risks of anesthesia including but not limited to:  - adverse reactions to medications - risk of airway placement if required - damage to eyes, teeth, lips or other oral mucosa - nerve damage due to positioning  - sore throat or hoarseness - Damage to heart, brain, nerves, lungs, other parts of body or loss of life  Patient voiced understanding and assent.)        Anesthesia Quick Evaluation

## 2024-01-15 NOTE — Anesthesia Postprocedure Evaluation (Signed)
 Anesthesia Post Note  Patient: Bryce Brock  Procedure(s) Performed: CARDIOVERSION  Patient location during evaluation: Specials Recovery Anesthesia Type: General Level of consciousness: awake and alert Pain management: pain level controlled Vital Signs Assessment: post-procedure vital signs reviewed and stable Respiratory status: spontaneous breathing, nonlabored ventilation, respiratory function stable and patient connected to nasal cannula oxygen Cardiovascular status: blood pressure returned to baseline and stable Postop Assessment: no apparent nausea or vomiting Anesthetic complications: no   No notable events documented.   Last Vitals:  Vitals:   01/15/24 0815 01/15/24 0830  BP: (!) 112/90 (!) 130/48  Pulse: 67 64  Resp: 17 16  Temp:  36.6 C  SpO2: 95% 96%    Last Pain:  Vitals:   01/15/24 0830  TempSrc: Temporal  PainSc:                  Fairy POUR Rowen Wilmer

## 2024-01-16 ENCOUNTER — Encounter: Payer: Self-pay | Admitting: Internal Medicine

## 2024-03-09 NOTE — CV Procedure (Signed)
 Electrical Cardioversion Procedure Note   Procedure: Electrical Cardioversion Indications:  Atrial Fibrillation  Procedure Details Consent: Risks of procedure as well as the alternatives and risks of each were explained to the (patient/caregiver).  Consent for procedure obtained. Time Out: Verified patient identification, verified procedure, site/side was marked, verified correct patient position, special equipment/implants available, medications/allergies/relevent history reviewed, required imaging and test results available.  Performed  Patient placed on cardiac monitor, pulse oximetry, supplemental oxygen as necessary.  Sedation given: Propofol  as per anesthesia Pacer pads placed anterior and posterior chest.  Cardioverted 4 time(s).  Max 360 joules biphasic Cardioverted at unsuccessful.  Evaluation Findings: Post procedure EKG shows: Atrial Fibrillation Complications: None Patient did tolerate procedure well.   Cara Lovelace MD 01/15/24
